# Patient Record
Sex: Female | Born: 1955 | Race: White | Hispanic: No | Marital: Married | Smoking: Never smoker
Health system: Southern US, Community
[De-identification: ages and names within clinical notes are randomized; demographics above are authoritative.]

## PROBLEM LIST (undated history)

## (undated) DIAGNOSIS — T7840XA Allergy, unspecified, initial encounter: Secondary | ICD-10-CM

## (undated) DIAGNOSIS — I10 Essential (primary) hypertension: Secondary | ICD-10-CM

## (undated) DIAGNOSIS — E119 Type 2 diabetes mellitus without complications: Secondary | ICD-10-CM

## (undated) DIAGNOSIS — R7301 Impaired fasting glucose: Secondary | ICD-10-CM

## (undated) DIAGNOSIS — E785 Hyperlipidemia, unspecified: Secondary | ICD-10-CM

## (undated) DIAGNOSIS — M8580 Other specified disorders of bone density and structure, unspecified site: Secondary | ICD-10-CM

## (undated) HISTORY — DX: Essential (primary) hypertension: I10

## (undated) HISTORY — DX: Impaired fasting glucose: R73.01

## (undated) HISTORY — PX: DILATION AND CURETTAGE OF UTERUS: SHX78

## (undated) HISTORY — DX: Type 2 diabetes mellitus without complications: E11.9

## (undated) HISTORY — DX: Other specified disorders of bone density and structure, unspecified site: M85.80

## (undated) HISTORY — DX: Allergy, unspecified, initial encounter: T78.40XA

## (undated) HISTORY — DX: Hyperlipidemia, unspecified: E78.5

## (undated) HISTORY — PX: COLONOSCOPY: SHX174

---

## 2007-02-01 ENCOUNTER — Emergency Department (HOSPITAL_COMMUNITY): Admission: EM | Admit: 2007-02-01 | Discharge: 2007-02-02 | Payer: Self-pay | Admitting: Emergency Medicine

## 2007-09-01 IMAGING — CT CT HEAD W/O CM
1 of 2 series · 13 of 30 positions shown, 17 images · IV contrast (agent unspecified)
Comparison: none

CLINICAL DATA: High blood pressure. Dizziness.
 HEAD CT WITHOUT CONTRAST:
TECHNIQUE: Contiguous axial images were obtained from the base of the skull through the vertex according to standard protocol without contrast.

[Series 2: brain · axial · 0.47mm/px · z∈[+126,+258]mm · 13 of 32 slices shown, 17 images]
[im 3/32  brain]
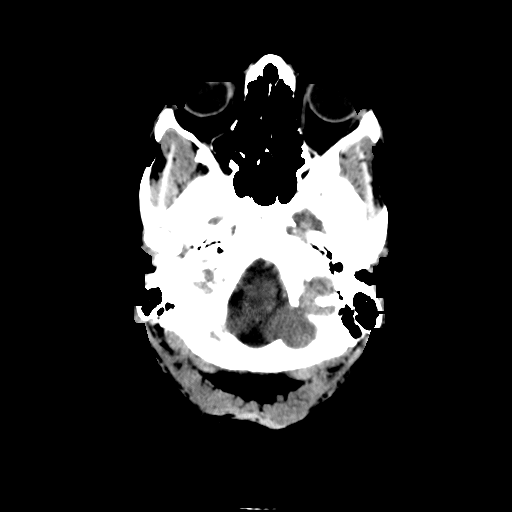
[im 3/32  bone]
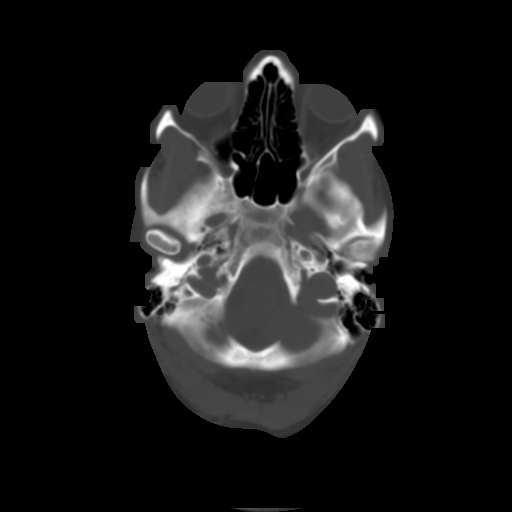
[im 5/32  brain]
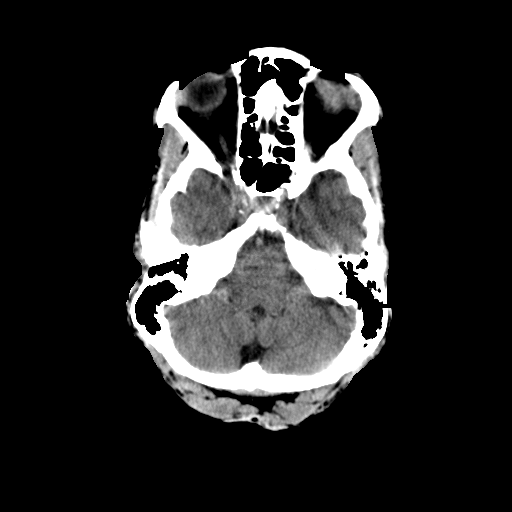
[im 7/32  brain]
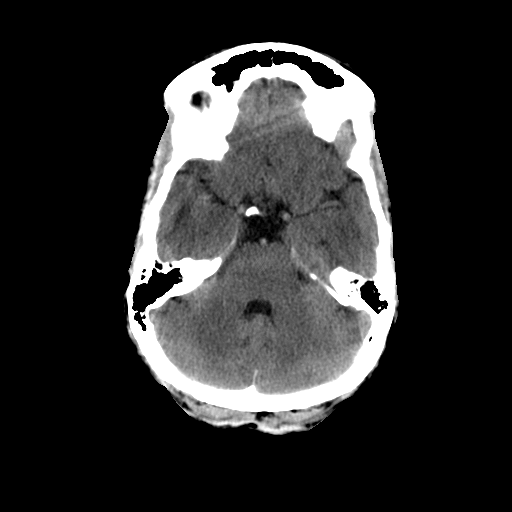
[im 9/32  brain]
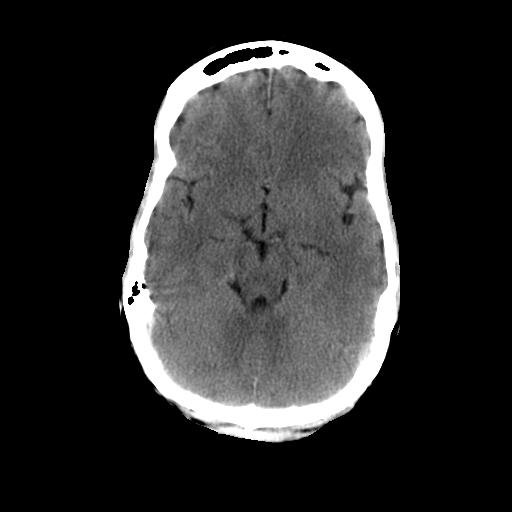
[im 12/32  brain]
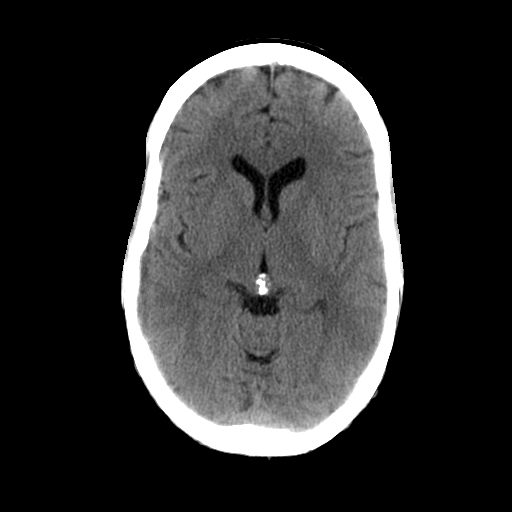
[im 12/32  bone]
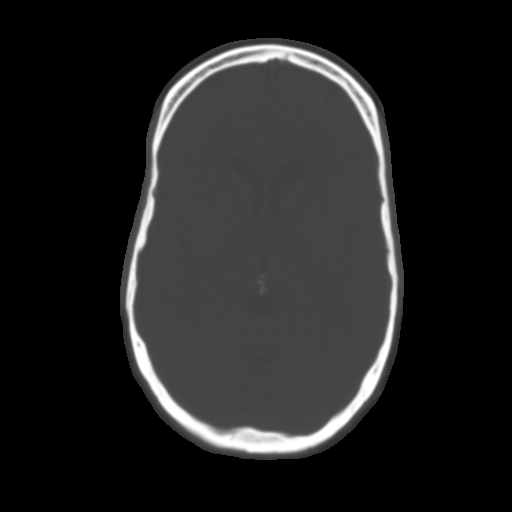
[im 14/32  brain]
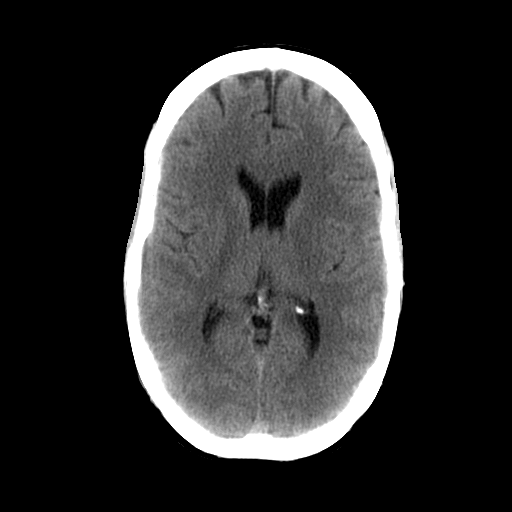
[im 16/32  brain]
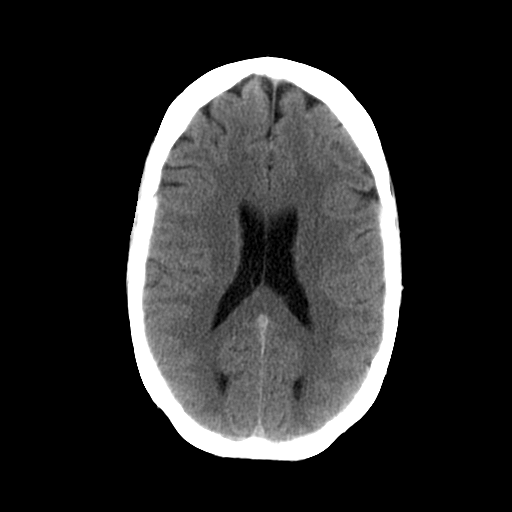
[im 18/32  brain]
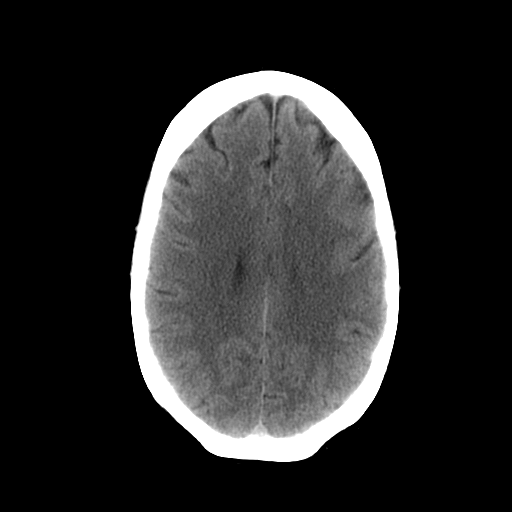
[im 20/32  brain]
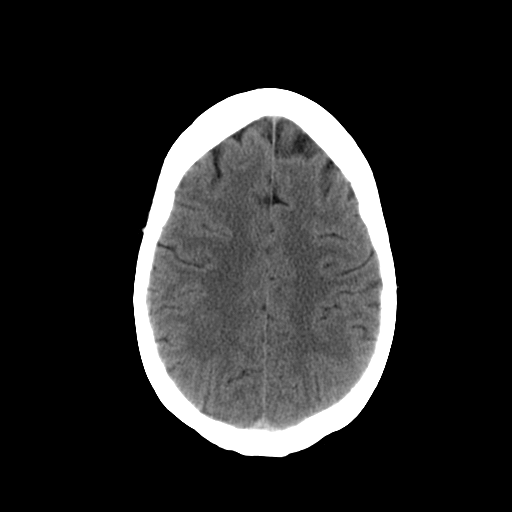
[im 20/32  bone]
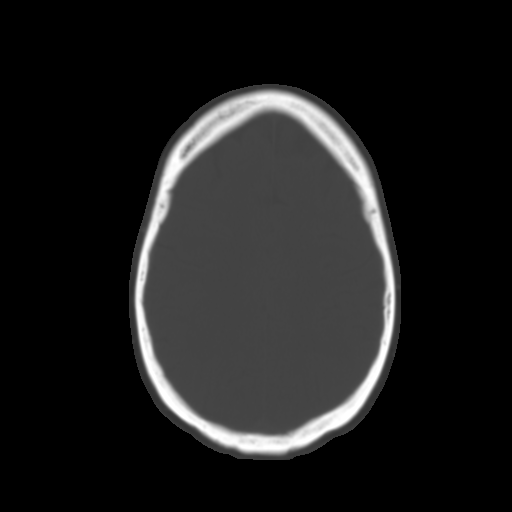
[im 23/32  brain]
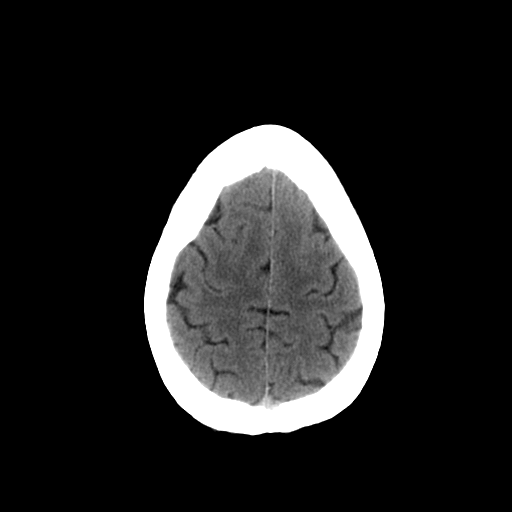
[im 25/32  brain]
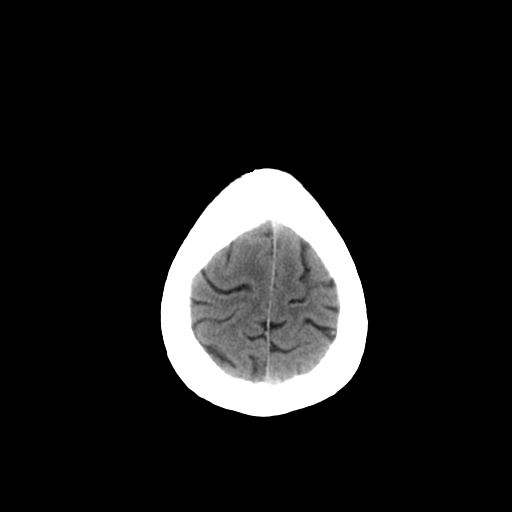
[im 27/32  brain]
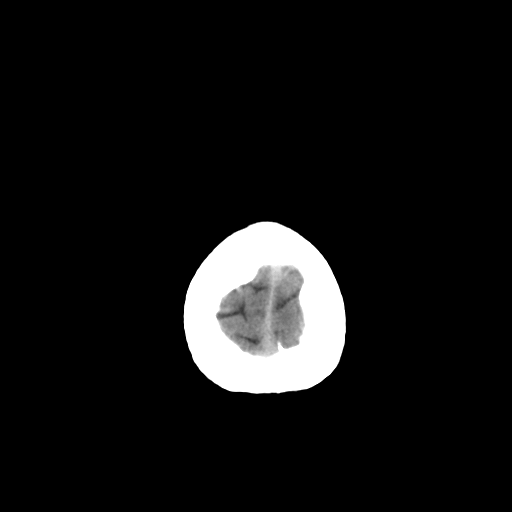
[im 29/32  brain]
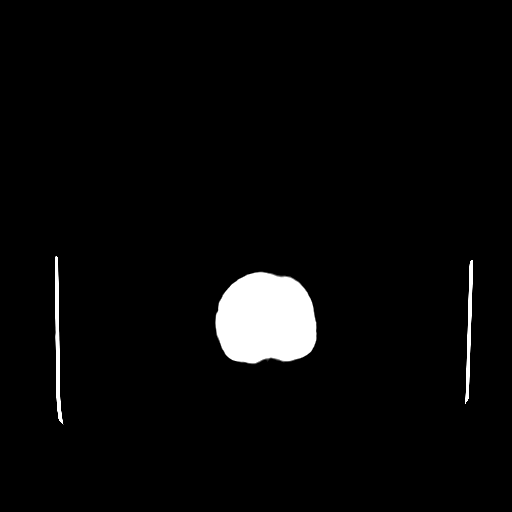
[im 29/32  bone]
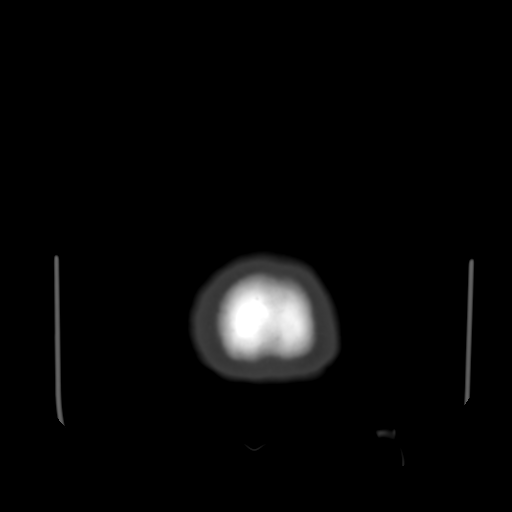

[13 of 30 positions shown; findings below may reference images not displayed]

FINDINGS: The brain appears normal without evidence of hemorrhage, infarct, mass, mass effect, midline shift or abnormal extraaxial fluid collection. No hydrocephalus.  Imaged paranasal sinuses and mastoid air cells are clear.
IMPRESSION: No acute intracranial abnormality.

## 2007-12-20 LAB — HM COLONOSCOPY

## 2010-12-28 LAB — HM DEXA SCAN: HM Dexa Scan: NORMAL

## 2012-01-18 LAB — HM PAP SMEAR

## 2013-01-19 LAB — HM MAMMOGRAPHY

## 2014-02-07 ENCOUNTER — Other Ambulatory Visit: Payer: Self-pay | Admitting: *Deleted

## 2014-02-07 DIAGNOSIS — R5381 Other malaise: Secondary | ICD-10-CM

## 2014-02-07 DIAGNOSIS — IMO0001 Reserved for inherently not codable concepts without codable children: Secondary | ICD-10-CM

## 2014-02-07 DIAGNOSIS — Z0000 Encounter for general adult medical examination without abnormal findings: Secondary | ICD-10-CM

## 2014-02-07 DIAGNOSIS — R5383 Other fatigue: Secondary | ICD-10-CM

## 2014-02-07 DIAGNOSIS — E1165 Type 2 diabetes mellitus with hyperglycemia: Principal | ICD-10-CM

## 2014-02-07 DIAGNOSIS — E785 Hyperlipidemia, unspecified: Secondary | ICD-10-CM

## 2014-02-10 ENCOUNTER — Other Ambulatory Visit: Payer: Self-pay

## 2014-02-11 ENCOUNTER — Encounter: Payer: Self-pay | Admitting: *Deleted

## 2014-02-11 DIAGNOSIS — N951 Menopausal and female climacteric states: Secondary | ICD-10-CM | POA: Insufficient documentation

## 2014-02-11 DIAGNOSIS — R000 Tachycardia, unspecified: Secondary | ICD-10-CM | POA: Insufficient documentation

## 2014-02-11 DIAGNOSIS — R030 Elevated blood-pressure reading, without diagnosis of hypertension: Secondary | ICD-10-CM | POA: Insufficient documentation

## 2014-02-11 DIAGNOSIS — M159 Polyosteoarthritis, unspecified: Secondary | ICD-10-CM

## 2014-02-11 LAB — COMPLETE METABOLIC PANEL WITH GFR
ALT: 13 U/L (ref 0–35)
AST: 14 U/L (ref 0–37)
Albumin: 4.4 g/dL (ref 3.5–5.2)
Alkaline Phosphatase: 49 U/L (ref 39–117)
BUN: 18 mg/dL (ref 6–23)
CO2: 27 mEq/L (ref 19–32)
Calcium: 9.1 mg/dL (ref 8.4–10.5)
Chloride: 102 mEq/L (ref 96–112)
Creat: 0.68 mg/dL (ref 0.50–1.10)
GFR, Est African American: >89 mL/min
GFR, Est Non African American: >89 mL/min
Glucose, Bld: 101 mg/dL — ABNORMAL HIGH (ref 70–99)
Potassium: 4.2 mEq/L (ref 3.5–5.3)
Sodium: 140 mEq/L (ref 135–145)
Total Bilirubin: 0.5 mg/dL (ref 0.2–1.2)
Total Protein: 6.6 g/dL (ref 6.0–8.3)

## 2014-02-11 LAB — CBC WITH DIFFERENTIAL/PLATELET
Basophils Absolute: 0 10*3/uL (ref 0.0–0.1)
Basophils Relative: 0 % (ref 0–1)
Eosinophils Absolute: 0.1 10*3/uL (ref 0.0–0.7)
Eosinophils Relative: 2 % (ref 0–5)
HCT: 40.7 % (ref 36.0–46.0)
Hemoglobin: 13.9 g/dL (ref 12.0–15.0)
Lymphocytes Relative: 30 % (ref 12–46)
Lymphs Abs: 1.9 10*3/uL (ref 0.7–4.0)
MCH: 29.6 pg (ref 26.0–34.0)
MCHC: 34.2 g/dL (ref 30.0–36.0)
MCV: 86.6 fL (ref 78.0–100.0)
Monocytes Absolute: 0.4 10*3/uL (ref 0.1–1.0)
Monocytes Relative: 6 % (ref 3–12)
Neutro Abs: 3.8 10*3/uL (ref 1.7–7.7)
Neutrophils Relative %: 62 % (ref 43–77)
Platelets: 309 10*3/uL (ref 150–400)
RBC: 4.7 MIL/uL (ref 3.87–5.11)
RDW: 13.5 % (ref 11.5–15.5)
WBC: 6.2 10*3/uL (ref 4.0–10.5)

## 2014-02-11 LAB — TSH: TSH: 1.97 u[IU]/mL (ref 0.350–4.500)

## 2014-02-11 LAB — LIPID PANEL
Cholesterol: 181 mg/dL (ref 0–200)
HDL: 56 mg/dL (ref >39)
LDL Cholesterol: 101 mg/dL — ABNORMAL HIGH (ref 0–99)
Total CHOL/HDL Ratio: 3.2 Ratio
Triglycerides: 118 mg/dL (ref <150)
VLDL: 24 mg/dL (ref 0–40)

## 2014-02-11 LAB — HEMOGLOBIN A1C
Hgb A1c MFr Bld: 5.7 % — ABNORMAL HIGH (ref <5.7)
Mean Plasma Glucose: 117 mg/dL — ABNORMAL HIGH (ref <117)

## 2014-02-18 ENCOUNTER — Ambulatory Visit (INDEPENDENT_AMBULATORY_CARE_PROVIDER_SITE_OTHER): Payer: BC Managed Care – PPO | Admitting: Family Medicine

## 2014-02-18 ENCOUNTER — Encounter: Payer: Self-pay | Admitting: Family Medicine

## 2014-02-18 ENCOUNTER — Encounter (INDEPENDENT_AMBULATORY_CARE_PROVIDER_SITE_OTHER): Payer: Self-pay

## 2014-02-18 VITALS — BP 151/90 | HR 114 | Resp 16 | Ht 63.5 in | Wt 159.0 lb

## 2014-02-18 DIAGNOSIS — R7301 Impaired fasting glucose: Secondary | ICD-10-CM

## 2014-02-18 DIAGNOSIS — E785 Hyperlipidemia, unspecified: Secondary | ICD-10-CM

## 2014-02-18 MED ORDER — METFORMIN HCL ER (MOD) 500 MG PO TB24: 1000.0000 mg | ORAL_TABLET | Freq: Two times a day (BID) | ORAL | Status: DC

## 2014-02-18 MED ORDER — METFORMIN HCL ER 500 MG PO TB24: 1000.0000 mg | ORAL_TABLET | Freq: Every day | ORAL | Status: DC

## 2014-02-18 MED ORDER — METFORMIN HCL ER (OSM) 1000 MG PO TB24: 1000.0000 mg | ORAL_TABLET | Freq: Two times a day (BID) | ORAL | Status: DC

## 2014-02-18 MED ORDER — PRAVASTATIN SODIUM 40 MG PO TABS: 40.0000 mg | ORAL_TABLET | Freq: Every day | ORAL | Status: DC

## 2014-02-18 NOTE — Progress Notes (Signed)
Subjective:    Patient ID: Doris Robinson, female    DOB: 29-Jan-1956, 58 y.o.   MRN: [id number]  HPI  [name] is here today to go over her most recent lab results.  She has done well since her last office visit.  She needs to discuss the conditions listed below:  1)  Hyperlipidemia - She is taking pravastatin (40 mg daily). Her only complaint is that she feels that it gives her some dry mouth.       2)  IFG - She has been taking Glumetza (1000 mg daily).  She was unable to tolerate 2000 mg.  She needs a refill on it.     Review of Systems  Constitutional: Negative for activity change, fatigue and unexpected weight change.  HENT: Negative.   Eyes: Negative.   Respiratory: Negative for shortness of breath.   Cardiovascular: Negative for chest pain, palpitations and leg swelling.  Gastrointestinal: Negative for diarrhea and constipation.  Endocrine: Negative.        Dry mouth after taking pravastatin  Genitourinary: Negative for difficulty urinating.  Musculoskeletal: Negative.   Skin: Negative.   Neurological: Negative.   Hematological: Negative for adenopathy. Does not bruise/bleed easily.  Psychiatric/Behavioral: Negative for sleep disturbance and dysphoric mood. The patient is not nervous/anxious.      Past Medical History  Diagnosis Date  . Hyperlipidemia   . IFG (impaired fasting glucose)      Past Surgical History  Procedure Laterality Date  . Dilation and curettage of uterus       History   Social History Narrative   Marital Status:  Married Administrator, sports)   Children:  None    Pets:  None    Living Situation: Lives with spouse   Occupation: Retired    Education: Forensic psychologist   Tobacco Use/Exposure:  None    Alcohol Use:  Occasional   Drug Use:  None   Diet:  Regular   Exercise:  Walking 60 minutes per day   Hobbies: Reading, Ecologist     Family History  Problem Relation Age of Onset  . Heart disease Father   . Diabetes Father   .  Hyperlipidemia Father   . Hypertension Father   . Asthma Father   . Thyroid disease Father   . Stroke Maternal Grandmother   . Cancer Mother     Troat Cancer     Current Outpatient Prescriptions on File Prior to Visit  Medication Sig Dispense Refill  . progesterone (PROMETRIUM) 100 MG capsule Take 100 mg by mouth daily.       No current facility-administered medications on file prior to visit.     Allergies  Allergen Reactions  . Sulfa Antibiotics Hives     Immunization History  Administered Date(s) Administered  . Tdap 11/07/2007       Objective:   Physical Exam  Vitals reviewed. Constitutional: She is oriented to person, place, and time.  Eyes: Conjunctivae are normal. No scleral icterus.  Neck: Neck supple. No thyromegaly present.  Cardiovascular: Normal rate, regular rhythm and normal heart sounds.   Pulmonary/Chest: Effort normal and breath sounds normal.  Musculoskeletal: She exhibits no edema and no tenderness.  Lymphadenopathy:    She has no cervical adenopathy.  Neurological: She is alert and oriented to person, place, and time.  Skin: Skin is warm and dry.  Psychiatric: She has a normal mood and affect. Her behavior is normal. Judgment and thought content normal.      Assessment &  Plan:    Adlynn was seen today for medication management.  Diagnoses and associated orders for this visit:  Other and unspecified hyperlipidemia - pravastatin (PRAVACHOL) 40 MG tablet; Take 1 tablet (40 mg total) by mouth daily.  IFG (impaired fasting glucose) - metFORMIN (GLUMETZA) 500 MG (MOD) 24 hr tablet; Take 2 tablets (1,000 mg total) by mouth 2 (two) times daily with a meal.

## 2014-02-20 MED ORDER — METFORMIN HCL ER (MOD) 500 MG PO TB24: 1000.0000 mg | ORAL_TABLET | Freq: Two times a day (BID) | ORAL | Status: DC

## 2018-02-13 ENCOUNTER — Other Ambulatory Visit: Payer: Self-pay | Admitting: Obstetrics and Gynecology

## 2018-02-13 DIAGNOSIS — R928 Other abnormal and inconclusive findings on diagnostic imaging of breast: Secondary | ICD-10-CM

## 2018-02-16 ENCOUNTER — Ambulatory Visit
Admission: RE | Admit: 2018-02-16 | Discharge: 2018-02-16 | Disposition: A | Discharge status: Home / Self Care | Payer: BLUE CROSS/BLUE SHIELD | Source: Ambulatory Visit | Attending: Obstetrics and Gynecology | Admitting: Obstetrics and Gynecology

## 2018-02-16 ENCOUNTER — Ambulatory Visit: Payer: Self-pay

## 2018-02-16 DIAGNOSIS — R928 Other abnormal and inconclusive findings on diagnostic imaging of breast: Secondary | ICD-10-CM

## 2018-03-09 ENCOUNTER — Encounter: Payer: Self-pay | Admitting: Internal Medicine

## 2018-05-07 ENCOUNTER — Ambulatory Visit (AMBULATORY_SURGERY_CENTER): Payer: Self-pay | Admitting: *Deleted

## 2018-05-07 ENCOUNTER — Other Ambulatory Visit: Payer: Self-pay

## 2018-05-07 VITALS — Ht 63.0 in | Wt 167.0 lb

## 2018-05-07 DIAGNOSIS — Z1211 Encounter for screening for malignant neoplasm of colon: Secondary | ICD-10-CM

## 2018-05-07 MED ORDER — NA SULFATE-K SULFATE-MG SULF 17.5-3.13-1.6 GM/177ML PO SOLN: 1.0000 | Freq: Once | ORAL | 0 refills | Status: AC

## 2018-05-07 NOTE — Progress Notes (Signed)
No egg or soy allergy known to patient  No issues with past sedation with any surgeries  or procedures, no intubation problems  No diet pills per patient No home 02 use per patient  No blood thinners per patient  Pt denies issues with constipation  No A fib or A flutter  EMMI video sent to pt's e mail  Suprep pay no more than $50 coupon to pt today in [place] Husband in [place] with pt today

## 2018-05-21 ENCOUNTER — Ambulatory Visit (AMBULATORY_SURGERY_CENTER): Payer: BLUE CROSS/BLUE SHIELD | Admitting: Internal Medicine

## 2018-05-21 ENCOUNTER — Encounter: Payer: Self-pay | Admitting: Internal Medicine

## 2018-05-21 VITALS — BP 128/77 | HR 64 | Temp 97.8°F | Resp 18 | Ht 63.0 in | Wt 167.0 lb

## 2018-05-21 DIAGNOSIS — D125 Benign neoplasm of sigmoid colon: Secondary | ICD-10-CM | POA: Diagnosis not present

## 2018-05-21 DIAGNOSIS — Z1211 Encounter for screening for malignant neoplasm of colon: Secondary | ICD-10-CM | POA: Diagnosis not present

## 2018-05-21 DIAGNOSIS — K635 Polyp of colon: Secondary | ICD-10-CM

## 2018-05-21 MED ORDER — SODIUM CHLORIDE 0.9 % IV SOLN: 500.0000 mL | Freq: Once | INTRAVENOUS | Status: AC

## 2018-05-21 NOTE — Op Note (Signed)
Doris Robinson Procedure Date: 05/21/2018 8:41 AM MRN: [id number] Endoscopist: Jerene Bears , MD Age: 62 Referring MD:  Date of Birth: 06/09/1956 Gender: Female Account #: [id number] Procedure:                Colonoscopy Indications:              Screening for colorectal malignant neoplasm, Last                            colonoscopy: 2009 Medicines:                Monitored Anesthesia Care Procedure:                Pre-Anesthesia Assessment:                           - Prior to the procedure, a History and Physical                            was performed, and patient medications and                            allergies were reviewed. The patient's tolerance of                            previous anesthesia was also reviewed. The risks                            and benefits of the procedure and the sedation                            options and risks were discussed with the patient.                            All questions were answered, and informed consent                            was obtained. Prior Anticoagulants: The patient has                            taken no previous anticoagulant or antiplatelet                            agents. ASA Grade Assessment: II - A patient with                            mild systemic disease. After reviewing the risks                            and benefits, the patient was deemed in                            satisfactory condition to undergo the procedure.  After obtaining informed consent, the colonoscope                            was passed under direct vision. Throughout the                            procedure, the patient's blood pressure, pulse, and                            oxygen saturations were monitored continuously. The                            Model PCF-H190DL [id number]) scope was introduced                            through the anus and advanced to the cecum,                             identified by appendiceal orifice and ileocecal                            valve. The colonoscopy was performed without                            difficulty. The patient tolerated the procedure                            well. The quality of the bowel preparation was                            good. The ileocecal valve, appendiceal orifice, and                            rectum were photographed. Scope In: 8:52:22 AM Scope Out: 9:11:18 AM Scope Withdrawal Time: 0 hours 12 minutes 18 seconds  Total Procedure Duration: 0 hours 18 minutes 56 seconds  Findings:                 The digital rectal exam was normal.                           Two sessile polyps were found in the sigmoid colon.                            The polyps were 3 to 4 mm in size. These polyps                            were removed with a cold snare. Resection and                            retrieval were complete.                           Multiple small-mouthed diverticula were found in  the sigmoid colon.                           Internal hemorrhoids were found during                            retroflexion. The hemorrhoids were small. Complications:            No immediate complications. Estimated Blood Loss:     Estimated blood loss was minimal. Impression:               - Two 3 to 4 mm polyps in the sigmoid colon,                            removed with a cold snare. Resected and retrieved.                           - Diverticulosis in the sigmoid colon.                           - Small internal hemorrhoids. Recommendation:           - Patient has a contact number available for                            emergencies. The signs and symptoms of potential                            delayed complications were discussed with the                            patient. Return to normal activities tomorrow.                            Written discharge instructions were  provided to the                            patient.                           - Resume previous diet.                           - Continue present medications.                           - Await pathology results.                           - Repeat colonoscopy is recommended. The                            colonoscopy date will be determined after pathology                            results from today's exam become available for  review. Jerene Bears, MD 05/21/2018 9:18:35 AM This report has been signed electronically.

## 2018-05-21 NOTE — Patient Instructions (Signed)
Impression/Recommendations:  Polyp handout given to patient. Diverticulosis handout given to patient. Hemorrhoid handout given to patient.  Resume previous diet. Continue present medications.  Repeat colonoscopy recommended.  Date to be determined after pathology results reviewed.  YOU HAD AN ENDOSCOPIC PROCEDURE TODAY AT [facility]:   Refer to the procedure report that was given to you for any specific questions about what was found during the examination.  If the procedure report does not answer your questions, please call your gastroenterologist to clarify.  If you requested that your care partner not be given the details of your procedure findings, then the procedure report has been included in a sealed envelope for you to review at your convenience later.  YOU SHOULD EXPECT: Some feelings of bloating in the abdomen. Passage of more gas than usual.  Walking can help get rid of the air that was put into your GI tract during the procedure and reduce the bloating. If you had a lower endoscopy (such as a colonoscopy or flexible sigmoidoscopy) you may notice spotting of blood in your stool or on the toilet paper. If you underwent a bowel prep for your procedure, you may not have a normal bowel movement for a few days.  Please Note:  You might notice some irritation and congestion in your nose or some drainage.  This is from the oxygen used during your procedure.  There is no need for concern and it should clear up in a day or so.  SYMPTOMS TO REPORT IMMEDIATELY:   Following lower endoscopy (colonoscopy or flexible sigmoidoscopy):  Excessive amounts of blood in the stool  Significant tenderness or worsening of abdominal pains  Swelling of the abdomen that is new, acute  Fever of 100F or higher For urgent or emergent issues, a gastroenterologist can be reached at any hour by calling [phone].   DIET:  We do recommend a small meal at first, but then you may proceed to  your regular diet.  Drink plenty of fluids but you should avoid alcoholic beverages for 24 hours.  ACTIVITY:  You should plan to take it easy for the rest of today and you should NOT DRIVE or use heavy machinery until tomorrow (because of the sedation medicines used during the test).    FOLLOW UP: Our staff will call the number listed on your records the next business day following your procedure to check on you and address any questions or concerns that you may have regarding the information given to you following your procedure. If we do not reach you, we will leave a message.  However, if you are feeling well and you are not experiencing any problems, there is no need to return our call.  We will assume that you have returned to your regular daily activities without incident.  If any biopsies were taken you will be contacted by phone or by letter within the next 1-3 weeks.  Please call us at [phone] if you have not heard about the biopsies in 3 weeks.    SIGNATURES/CONFIDENTIALITY: You and/or your care partner have signed paperwork which will be entered into your electronic medical record.  These signatures attest to the fact that that the information above on your After Visit Summary has been reviewed and is understood.  Full responsibility of the confidentiality of this discharge information lies with you and/or your care-partner.

## 2018-05-21 NOTE — Progress Notes (Signed)
To PACU, VSS. Report to [email or web address] 

## 2018-05-21 NOTE — Progress Notes (Signed)
Pt's states no medical or surgical changes since previsit or office visit. 

## 2018-05-21 NOTE — Progress Notes (Signed)
Called to room to assist during endoscopic procedure.  Patient ID and intended procedure confirmed with present staff. Received instructions for my participation in the procedure from the performing physician.  

## 2018-05-22 ENCOUNTER — Telehealth: Payer: Self-pay | Admitting: *Deleted

## 2018-05-22 NOTE — Telephone Encounter (Signed)
  Follow up Call-  Call back number 05/21/2018  Post procedure Call Back phone  # [phone]  Permission to leave phone message Yes  Some recent data might be hidden     Patient questions:  Do you have a fever, pain , or abdominal swelling? No. Pain Score  0 *  Have you tolerated food without any problems? Yes.    Have you been able to return to your normal activities? Yes.    Do you have any questions about your discharge instructions: Diet   No. Medications  No. Follow up visit  No.  Do you have questions or concerns about your Care? No.  Actions: * If pain score is 4 or above: No action needed, pain <4.

## 2018-05-28 ENCOUNTER — Encounter: Payer: Self-pay | Admitting: Internal Medicine

## 2018-08-09 ENCOUNTER — Encounter: Payer: Self-pay | Admitting: Nurse Practitioner

## 2018-08-31 ENCOUNTER — Ambulatory Visit: Payer: BLUE CROSS/BLUE SHIELD | Admitting: Physician Assistant

## 2018-08-31 ENCOUNTER — Encounter: Payer: Self-pay | Admitting: Physician Assistant

## 2018-08-31 VITALS — BP 156/86 | HR 80 | Ht 63.0 in | Wt 166.5 lb

## 2018-08-31 DIAGNOSIS — I10 Essential (primary) hypertension: Secondary | ICD-10-CM | POA: Diagnosis not present

## 2018-08-31 DIAGNOSIS — R0683 Snoring: Secondary | ICD-10-CM

## 2018-08-31 DIAGNOSIS — R000 Tachycardia, unspecified: Secondary | ICD-10-CM | POA: Diagnosis not present

## 2018-08-31 DIAGNOSIS — R030 Elevated blood-pressure reading, without diagnosis of hypertension: Secondary | ICD-10-CM | POA: Diagnosis not present

## 2018-08-31 NOTE — Progress Notes (Signed)
Cardiology Office Note    Date:  08/31/2018   ID:  Doris Robinson, DOB 1956-11-19, MRN [id number]  PCP:  Jonathon Resides, MD  Cardiologist:  New to Dr. Harrington Challenger  Chief Complaint: Blood pressure  History of Present Illness:   Doris Robinson is a 62 y.o. female with hx of DM, HTN and HLD referred by Dr. Dion Saucier for elevated blood pressure.   Patient always has normal blood pressure at home but high at PCP office thus send here for evaluation. Her BP runs in 100-130/60-70s at home. During office visit today manual blood pressure is 156/86 and BP on home machine is 169/104. She is anxious person at baseline. [place] does [place] 2 times/week and does push moving loan without any limitation. The patient denies nausea, vomiting, fever, chest pain, palpitations, shortness of breath, orthopnea, PND, dizziness, syncope, cough, congestion, abdominal pain, hematochezia, melena, lower extremity edema. Husband does complain of snoring. She have noted mild day time tiredness.   Remote exercise stress test was normal (about 10 years ago). Done for elevated blood pressure. Father had CABG at age 21.    Past Medical History:  Diagnosis Date  . Allergy    seasonal  . Diabetes mellitus without complication ([place])   . Hyperlipidemia   . Hypertension   . IFG (impaired fasting glucose)   . Osteopenia     Past Surgical History:  Procedure Laterality Date  . COLONOSCOPY    . DILATION AND CURETTAGE OF UTERUS      Current Medications: Prior to Admission medications   Medication Sig Start Date End Date Taking? Authorizing Provider  acetaminophen (TYLENOL) 500 MG tablet Take 500 mg by mouth every 6 (six) hours as needed.   Yes [provider]  cholecalciferol (VITAMIN D) 1000 units tablet Take 2,000 Units by mouth daily.   Yes [provider]  Coenzyme Q10 (CO Q 10) 100 MG CAPS Take by mouth daily.   Yes [provider]  estradiol (VIVELLE-DOT) 0.1 MG/24HR patch Place 1 patch onto the  skin 2 (two) times a week.   Yes [provider]  fluticasone (FLONASE) 50 MCG/ACT nasal spray 1 spray. 06/09/16  Yes [provider]  glucosamine-chondroitin 500-400 MG tablet Take 1 tablet by mouth 3 (three) times daily.   Yes [provider]  KOMBIGLYZE XR 5-500 MG TB24 TK 1 T PO Q NIGHT 04/17/18  Yes [provider]  losartan (COZAAR) 50 MG tablet Take 25 mg by mouth 2 (two) times daily after a meal.  03/18/18  Yes [provider]  Magnesium 250 MG TABS Take by mouth daily.   Yes [provider]  metFORMIN (GLUMETZA) 500 MG (MOD) 24 hr tablet Take 2 tablets (1,000 mg total) by mouth 2 (two) times daily with a meal. 02/20/14 08/31/18 Yes Zanard, Bernadene Bell, MD  Multiple Vitamins-Minerals (MULTIVITAMIN ADULT PO) Take by mouth daily. Centrum Silver   Yes [provider]  pravastatin (PRAVACHOL) 40 MG tablet Take 1 tablet (40 mg total) by mouth daily. 02/18/14 08/31/18 Yes Zanard, Bernadene Bell, MD  progesterone (PROMETRIUM) 100 MG capsule Take 100 mg by mouth daily.   Yes [provider]  rosuvastatin (CRESTOR) 20 MG tablet  04/16/18  Yes [provider]  vitamin C (ASCORBIC ACID) 500 MG tablet Take 500 mg by mouth daily.   Yes [provider]  Vitamins-Lipotropics (LIPO-FLAVONOID PLUS PO) Take by mouth daily.   Yes [provider]  zolpidem (AMBIEN) 5 MG tablet TK 1  T PO QD HS PRF SLEEP 03/07/18  Yes [provider]    Allergies:   Codeine and Sulfa antibiotics   Social History   Socioeconomic History  . Marital status: Married    Spouse name: Doren Custard  . Number of children: 0  . Years of education: 5  . Highest education level: Not on file  Occupational History  . Occupation: RETIRED  Social Needs  . Financial resource strain: Not on file  . Food insecurity:    Worry: Not on file    Inability: Not on file  . Transportation needs:    Medical: Not on file    Non-medical: Not on file  Tobacco  Use  . Smoking status: Never Smoker  . Smokeless tobacco: Never Used  Substance and Sexual Activity  . Alcohol use: Yes    Comment: 1 glass a week  . Drug use: No  . Sexual activity: Not Currently  Lifestyle  . Physical activity:    Days per week: Not on file    Minutes per session: Not on file  . Stress: Not on file  Relationships  . Social connections:    Talks on phone: Not on file    Gets together: Not on file    Attends religious service: Not on file    Active member of club or organization: Not on file    Attends meetings of clubs or organizations: Not on file    Relationship status: Not on file  Other Topics Concern  . Not on file  Social History Narrative   Marital Status:  Married Administrator, sports)   Children:  None    Pets:  None    Living Situation: Lives with spouse   Occupation: Retired    Education: Forensic psychologist   Tobacco Use/Exposure:  None    Alcohol Use:  Occasional   Drug Use:  None   Diet:  Regular   Exercise:  Walking 60 minutes per day   Hobbies: Reading, Ecologist     Family History:  The patient's family history includes Asthma in her father; Cancer in her mother; Coronary artery disease in her father; Diabetes in her father; Esophageal cancer in her mother; Heart disease in her father; Hyperlipidemia in her father; Hypertension in her father; Stroke in her maternal grandmother; Thyroid disease in her father.   ROS:   Please see the history of present illness.    ROS All other systems reviewed and are negative.   PHYSICAL EXAM:   VS:  BP (!) 156/86   Pulse 80   Ht 5\' 3"  (1.6 m)   Wt 166 lb 8 oz (75.5 kg)   SpO2 96%   BMI 29.49 kg/m    GEN: Well nourished, well developed, in no acute distress  HEENT: normal  Neck: no JVD, carotid bruits, or masses Cardiac: RRR; no murmurs, rubs, or gallops,no edema  Respiratory:  clear to auscultation bilaterally, normal work of breathing GI: soft, nontender, nondistended, + BS MS: no deformity or  atrophy  Skin: warm and dry, no rash Neuro:  Alert and Oriented x 3, Strength and sensation are intact Psych: euthymic mood, full affect  Wt Readings from Last 3 Encounters:  08/31/18 166 lb 8 oz (75.5 kg)  05/21/18 167 lb (75.8 kg)  05/07/18 167 lb (75.8 kg)      Studies/Labs Reviewed:   EKG:  EKG is ordered today.  The ekg ordered today demonstrates NSR with TWI in lead V1  Recent Labs: No results  found for requested labs within last 8760 hours.   Lipid Panel    Component Value Date/Time   CHOL 181 02/10/2014 0816   TRIG 118 02/10/2014 0816   HDL 56 02/10/2014 0816   CHOLHDL 3.2 02/10/2014 0816   VLDL 24 02/10/2014 0816   LDLCALC 101 (H) 02/10/2014 0816    Additional studies/ records that were reviewed today include:  As above    ASSESSMENT & PLAN:    1. Fluctuating blood pressure with hx of HTN -Her blood pressure runs normal at home. Manual blood pressure is 156/86 and BP on home machine is 169/104 in clinic today. She seems anxious today. Likely her symptoms due to [facility] Coat Syndrome. Husband noted snoring. She does have mild day time tiredness. She might have underlying sleep apnea. Declined sleep study. She will follow up with PCP for this.   2. Snoring As above  3. HLD Continue statin. Per PCP.   Plan reviewed with DOD Dr. Harrington Challenger.     Medication Adjustments/Labs and Tests Ordered: Current medicines are reviewed at length with the patient today.  Concerns regarding medicines are outlined above.  Medication changes, Labs and Tests ordered today are listed in the Patient Instructions below. Patient Instructions  Medication Instructions:  Your physician recommends that you continue on your current medications as directed. Please refer to the Current Medication list given to you today.   Labwork: None ordered  Testing/Procedures: YOUR PHYSICIAN WANTS YOU TO HAVE A 24-HOUR BLOOD PRESSURE MONITOR.  Follow-Up: Your physician wants you to follow-up BASED  UPON RESULTS  Any Other Special Instructions Will Be Listed Below (If Applicable).     If you need a refill on your cardiac medications before your next appointment, please call your pharmacy.          Jarrett Soho, [place]  08/31/2018 2:00 PM    [address], [place], [place]  [address] Phone: [phone]; Fax: [phone]

## 2018-08-31 NOTE — Patient Instructions (Addendum)
Medication Instructions:  Your physician recommends that you continue on your current medications as directed. Please refer to the Current Medication list given to you today.   Labwork: None ordered  Testing/Procedures: YOUR PHYSICIAN WANTS YOU TO HAVE A 24-HOUR BLOOD PRESSURE MONITOR.  Follow-Up: Your physician wants you to follow-up BASED UPON RESULTS  Any Other Special Instructions Will Be Listed Below (If Applicable).     If you need a refill on your cardiac medications before your next appointment, please call your pharmacy.

## 2018-09-03 ENCOUNTER — Ambulatory Visit: Payer: BLUE CROSS/BLUE SHIELD | Admitting: Nurse Practitioner

## 2018-09-11 ENCOUNTER — Encounter: Payer: Self-pay | Admitting: *Deleted

## 2018-09-11 ENCOUNTER — Ambulatory Visit (INDEPENDENT_AMBULATORY_CARE_PROVIDER_SITE_OTHER): Payer: BLUE CROSS/BLUE SHIELD

## 2018-09-11 DIAGNOSIS — R030 Elevated blood-pressure reading, without diagnosis of hypertension: Secondary | ICD-10-CM

## 2018-09-11 DIAGNOSIS — R000 Tachycardia, unspecified: Secondary | ICD-10-CM

## 2018-09-11 DIAGNOSIS — I10 Essential (primary) hypertension: Secondary | ICD-10-CM

## 2018-09-11 DIAGNOSIS — R0683 Snoring: Secondary | ICD-10-CM

## 2018-09-11 NOTE — Progress Notes (Signed)
Patient ID: Doris Robinson, female   DOB: 1956-04-07, 62 y.o.   MRN: [id number] 24 hour ambulatory blood pressure monitor applied using standard adult cuff.

## 2018-09-14 ENCOUNTER — Telehealth: Payer: Self-pay

## 2018-09-14 ENCOUNTER — Other Ambulatory Visit: Payer: Self-pay

## 2018-09-14 MED ORDER — LOSARTAN POTASSIUM 50 MG PO TABS: 50.0000 mg | ORAL_TABLET | Freq: Two times a day (BID) | ORAL | 0 refills | Status: DC

## 2018-09-14 NOTE — Telephone Encounter (Signed)
Per Dr. Harrington Challenger based on her Ambulatory Blood Pressure report.. Pt advised to increase her Cozaar ton 50mg  po bid. Pt verbalized understanding and will fax report to her PMD Dr. Larene Beach.

## 2018-09-16 IMAGING — MG DIGITAL DIAGNOSTIC UNILATERAL RIGHT MAMMOGRAM WITH TOMO AND CAD
4 series · 4 of 12 positions shown · non-contrast
Comparison: 02/08/2018, 02/07/2017 and earlier.

CLINICAL DATA: Recall from screening mammography with
tomosynthesis, possible asymmetry involving the central right breast
visible only on the MLO images.

EXAM:
DIGITAL DIAGNOSTIC UNILATERAL RIGHT MAMMOGRAM WITH CAD AND TOMO

[R ML synth-2D]
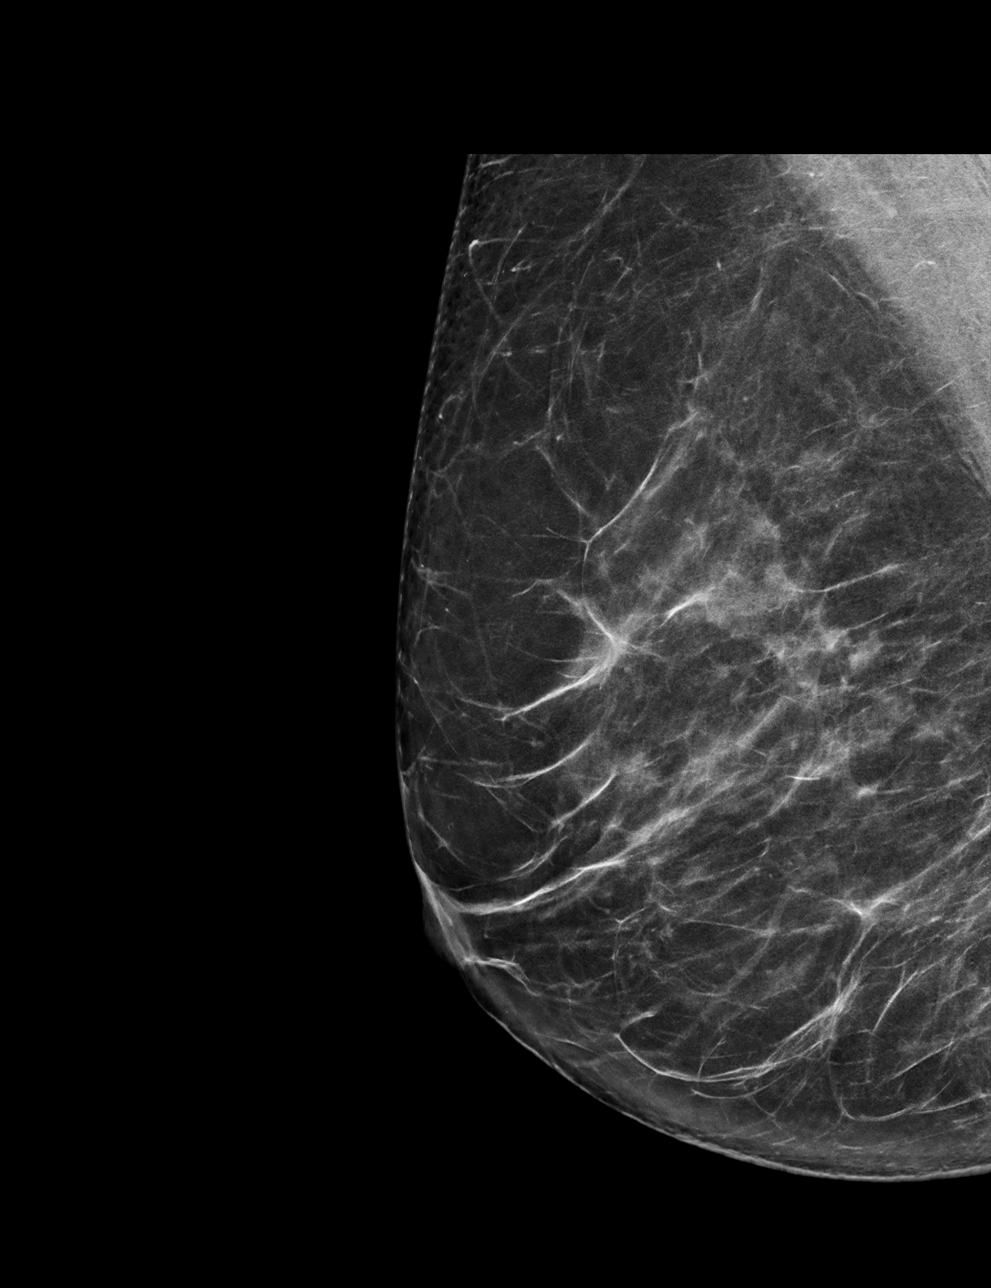

[R MLO synth-2D]
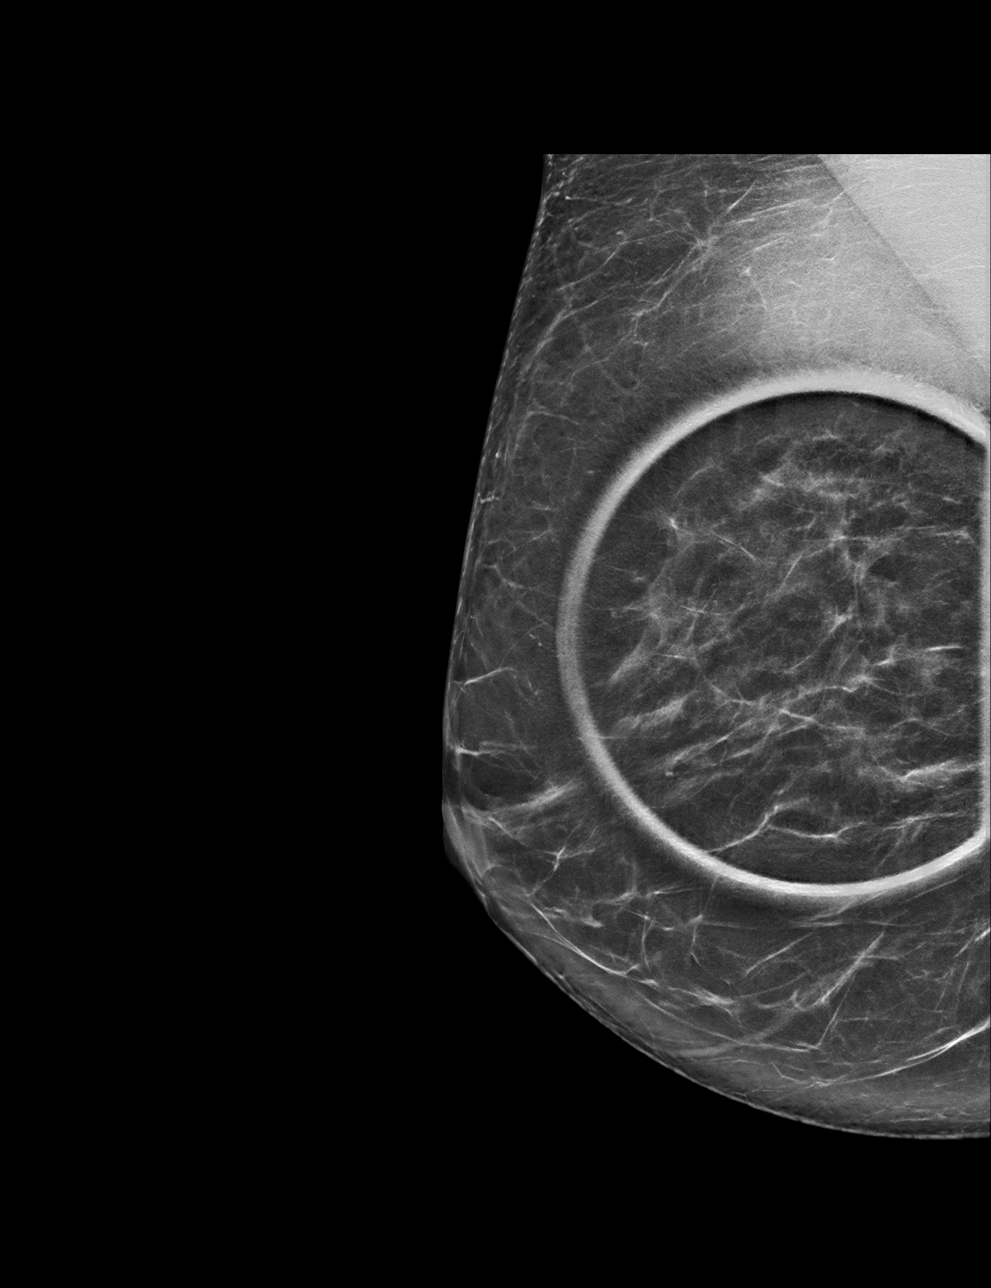

[R MLO tomo · tomo slice 41/80.0]
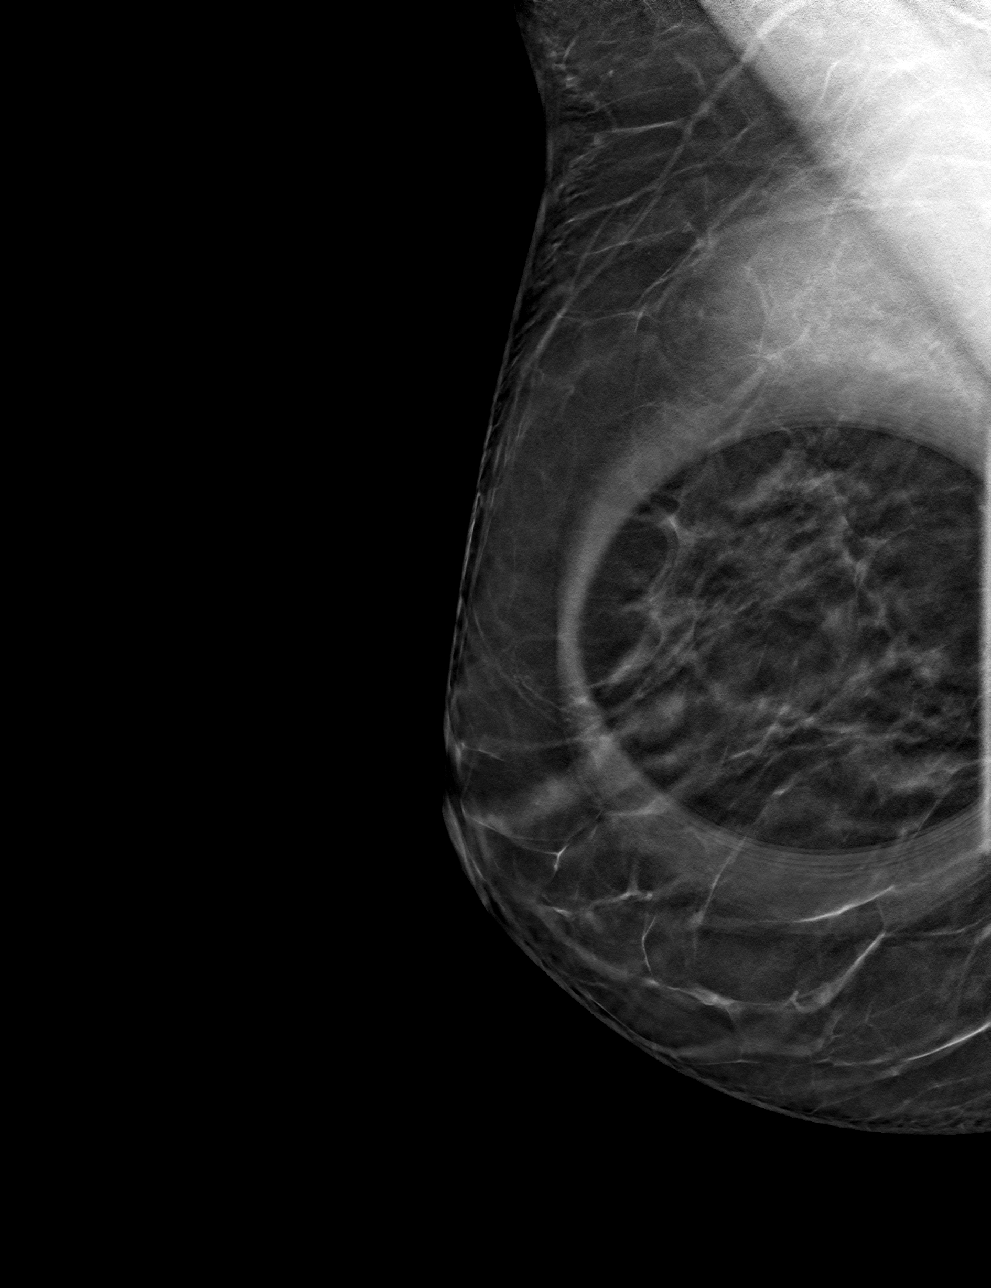

[R ML tomo · tomo slice 38/75.0]
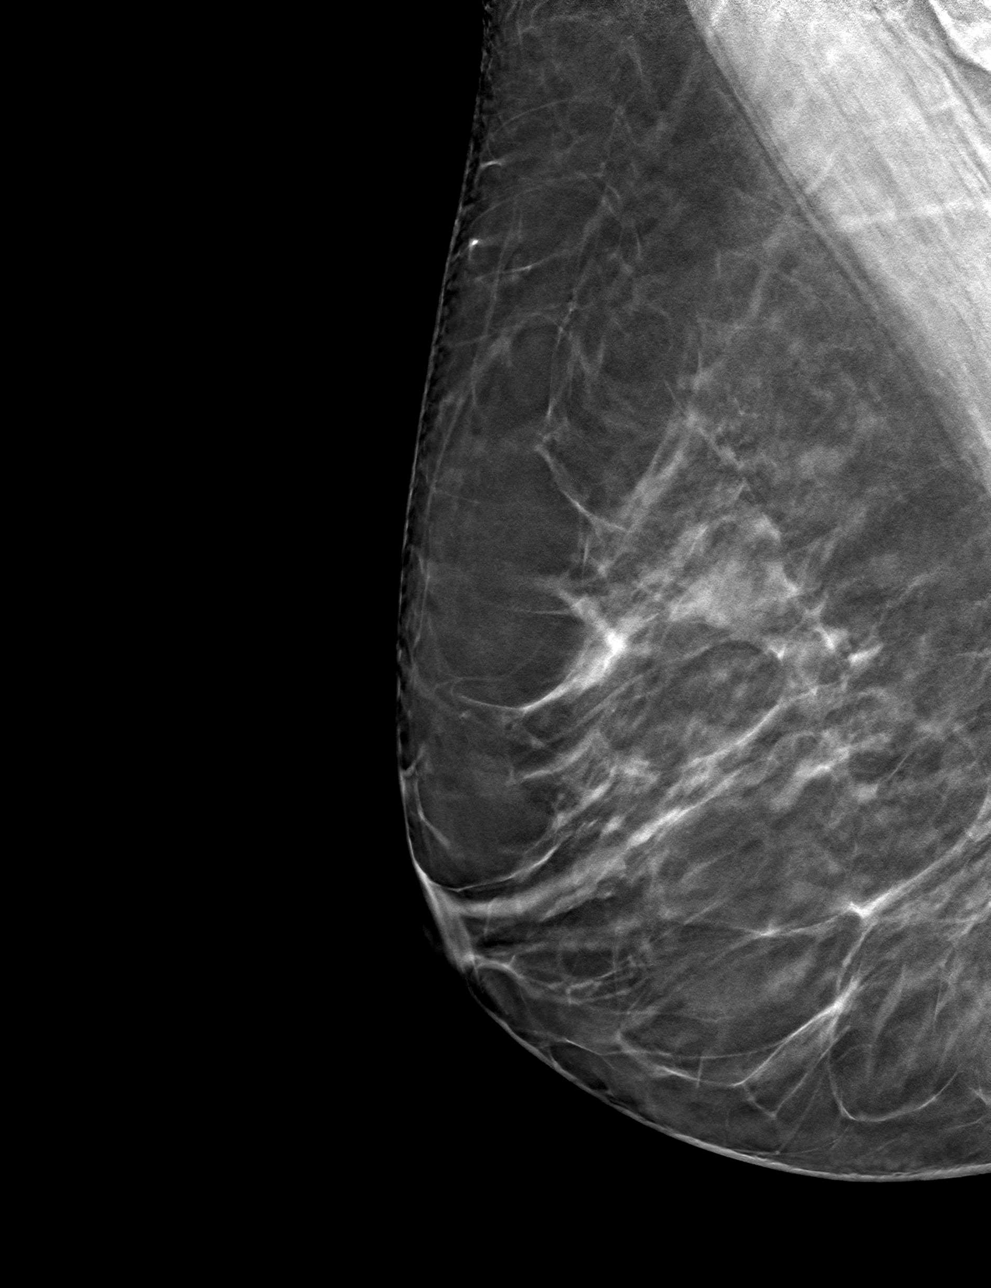

[4 of 12 positions shown; findings below may reference images not displayed]

ACR Breast Density Category b: There are scattered areas of
fibroglandular density.
FINDINGS: Tomosynthesis and synthesized spot compression MLO view of the area
of concern in the right breast and tomosynthesis and synthesized
full field mediolateral view of the right breast were obtained.

The possible asymmetry in the central right breast on screening
mammography disperses with compression and there is no underlying
mass or architectural distortion.

No findings suspicious for malignancy in the right breast.

The synthesized full field mediolateral image was processed with
CAD.
IMPRESSION: 1. No mammographic evidence of malignancy involving the right
breast.
2. The screening mammographic finding corresponds to normal focally
dense fibroglandular tissue.

RECOMMENDATION:
Screening mammogram in one year.(Code:YR-[id number])

I have discussed the findings and recommendations with the patient.
Results were also provided in writing at the conclusion of the
visit. If applicable, a reminder letter will be sent to the patient
regarding the next appointment.

BI-RADS CATEGORY  1: Negative.

## 2018-09-17 ENCOUNTER — Telehealth: Payer: Self-pay

## 2018-09-17 NOTE — Telephone Encounter (Signed)
**Note De-Identified Jayquan Bradsher Obfuscation** I have done a Losartan PA through covermymeds. Key: [id number]

## 2018-09-26 NOTE — Telephone Encounter (Signed)
**Note De-Identified [name] Obfuscation** Letter received Doris Robinson fax from [facility] stating that they have approved this Losartan PA. Approval good from 09/17/2018 until 09/15/2021  Reference# [id number]  I have notified the pts pharmacy.

## 2018-10-12 ENCOUNTER — Telehealth: Payer: Self-pay | Admitting: Physician Assistant

## 2018-10-12 NOTE — Telephone Encounter (Signed)
New Message          Dr Dion Saucier office is calling from [facility] to get the EKG results done on 08/31/2018 and the  24 hour Holter Monitor done on 09/11/2018 results faxed over. Their office # is [phone]  Their fax # is [phone]

## 2021-03-25 ENCOUNTER — Telehealth: Payer: Self-pay

## 2021-03-25 NOTE — Telephone Encounter (Signed)
Referral notes sent from [facility], Phone #: [phone], Fax #: [phone]   Notes sent to scheduling

## 2021-03-31 ENCOUNTER — Telehealth: Payer: Self-pay | Admitting: Internal Medicine

## 2021-03-31 NOTE — Telephone Encounter (Signed)
Patient requesting to switch from Dr. Harrington Challenger to Dr. Irish Lack

## 2021-04-01 NOTE — Telephone Encounter (Signed)
That is fine   It looks like I may have seen a few years ago with Vin

## 2021-05-10 ENCOUNTER — Ambulatory Visit: Payer: Self-pay | Admitting: Interventional Cardiology

## 2021-05-20 NOTE — Progress Notes (Signed)
Cardiology Office Note   Date:  05/21/2021   ID:  Doris Robinson, DOB [birth year], MRN [id number]  PCP:  Jonathon Resides, MD    No chief complaint on file.  HTN  Wt Readings from Last 3 Encounters:  05/21/21 178 lb 3.2 oz (80.8 kg)  08/31/18 166 lb 8 oz (75.5 kg)  05/21/18 167 lb (75.8 kg)       History of Present Illness: Doris Robinson is a 65 y.o. female   with hx of DM, HTN and HLD.  She was seen in 2019 by [facility].  24 hour ambulatory BP monitor  She has had issues with high BP readings in the office.  WHen she goes home, BP can be in the normal range. In Feb 2022, losartan was changed to irbesartan and Bystolic was added.   Three times in the last 4 months, She feels a headache, warm ears, "buzzing feeling."  BP would be higher at those times.  Husband notices that she snores and that she has some labored breathing during sleep.  Increased menopause sx recently.   Denies : Chest pain. Dizziness. Leg edema. Nitroglycerin use. Orthopnea. Palpitations. Paroxysmal nocturnal dyspnea. Shortness of breath. Syncope.       Past Medical History:  Diagnosis Date  . Allergy    seasonal  . Diabetes mellitus without complication ([place])   . Hyperlipidemia   . Hypertension   . IFG (impaired fasting glucose)   . Osteopenia     Past Surgical History:  Procedure Laterality Date  . COLONOSCOPY    . DILATION AND CURETTAGE OF UTERUS       Current Outpatient Medications  Medication Sig Dispense Refill  . acetaminophen (TYLENOL) 500 MG tablet Take 500 mg by mouth every 6 (six) hours as needed.    . cholecalciferol (VITAMIN D) 1000 units tablet Take 2,000 Units by mouth daily.    . clobetasol cream (TEMOVATE) 0.05 % Apply topically as needed.    . fluticasone (FLONASE) 50 MCG/ACT nasal spray 1 spray.    Marland Kitchen glucosamine-chondroitin 500-400 MG tablet Take 1 tablet by mouth 3 (three) times daily.    . irbesartan (AVAPRO) 300 MG tablet Take 300 mg by mouth every morning.    Marland Kitchen  JANUVIA 100 MG tablet Take 100 mg by mouth daily.    . metFORMIN (GLUCOPHAGE-XR) 500 MG 24 hr tablet Take 500 mg by mouth at bedtime.    Marland Kitchen MINOXIDIL, TOPICAL, 5 % SOLN at bedtime.    . Multiple Vitamins-Minerals (MULTIVITAMIN ADULT PO) Take by mouth daily. Centrum Silver    . nebivolol (BYSTOLIC) 5 MG tablet Take 5 mg by mouth daily.    . rosuvastatin (CRESTOR) 20 MG tablet   3  . vitamin C (ASCORBIC ACID) 500 MG tablet Take 500 mg by mouth daily.    . Vitamins-Lipotropics (LIPO-FLAVONOID PLUS PO) Take by mouth daily.     Current Facility-Administered Medications  Medication Dose Route Frequency Provider Last Rate Last Admin  . 0.9 %  sodium chloride infusion  500 mL Intravenous Once Pyrtle, Lajuan Lines, MD        Allergies:   Codeine and Sulfa antibiotics    Social History:  The patient  reports that she has never smoked. She has never used smokeless tobacco. She reports current alcohol use. She reports that she does not use drugs.   Family History:  The patient's family history includes Asthma in her father; Cancer in her mother; Coronary artery disease in her  father; Diabetes in her father; Esophageal cancer in her mother; Heart disease in her father; Hyperlipidemia in her father; Hypertension in her father; Stroke in her maternal grandmother; Thyroid disease in her father.    ROS:  Please see the history of present illness.   Otherwise, review of systems are positive for sleep disturbance.   All other systems are reviewed and negative.    PHYSICAL EXAM: VS:  BP (!) 174/82   Pulse 66   Ht 5\' 3"  (1.6 m)   Wt 178 lb 3.2 oz (80.8 kg)   SpO2 98%   BMI 31.57 kg/m  , BMI Body mass index is 31.57 kg/m. GEN: Well nourished, well developed, in no acute distress  HEENT: normal  Neck: no JVD, carotid bruits, or masses Cardiac: RRR; no murmurs, rubs, or gallops,no edema  Respiratory:  clear to auscultation bilaterally, normal work of breathing GI: soft, nontender, nondistended, + BS MS: no  deformity or atrophy  Skin: warm and dry, no rash Neuro:  Strength and sensation are intact Psych: euthymic mood, full affect   EKG:   The ekg ordered today demonstrates NSR, no ST changes; V1 T wave inversion unchanged   Recent Labs: No results found for requested labs within last 8760 hours.   Lipid Panel    Component Value Date/Time   CHOL 181 02/10/2014 0816   TRIG 118 02/10/2014 0816   HDL 56 02/10/2014 0816   CHOLHDL 3.2 02/10/2014 0816   VLDL 24 02/10/2014 0816   LDLCALC 101 (H) 02/10/2014 0816     Other studies Reviewed: Additional studies/ records that were reviewed today with results demonstrating: old ECG reviewed.   ASSESSMENT AND PLAN:  1. HTN: Controlled home readings.  Continue current meds. If systolic is > 242 at home and she has the symptomatic episode noted above, can take an extra Bystolic 5 mg x 1.  If this happens frequently , will have to adjust home meds. Home machine matched the manual blood pressure reading done by me here in the office, as noted above in the vitals section. 2. Hyperlipidemia: High-fiber, whole food, plant-based diet.  Avoid processed foods to decrease your salt intake. 3. Snoring: Declined sleep study in the past.  She does not feel that she is getting restful sleep. She wants to try to lose weight first before considering sleep study.    Current medicines are reviewed at length with the patient today.  The patient concerns regarding her medicines were addressed.  The following changes have been made:  No change  Labs/ tests ordered today include:   Orders Placed This Encounter  Procedures  . EKG 12-Lead    Recommend 150 minutes/week of aerobic exercise Low fat, low carb, high fiber diet recommended  Disposition:   FU in 1 year   Signed, Larae Grooms, MD  05/21/2021 3:59 PM    [facility] [address], [place], [place]  [address] Phone: [phone]; Fax: [phone]

## 2021-05-21 ENCOUNTER — Encounter: Payer: Self-pay | Admitting: Interventional Cardiology

## 2021-05-21 ENCOUNTER — Other Ambulatory Visit: Payer: Self-pay

## 2021-05-21 ENCOUNTER — Ambulatory Visit (INDEPENDENT_AMBULATORY_CARE_PROVIDER_SITE_OTHER): Payer: Medicare Other | Admitting: Interventional Cardiology

## 2021-05-21 VITALS — BP 174/82 | HR 66 | Ht 63.0 in | Wt 178.2 lb

## 2021-05-21 DIAGNOSIS — E782 Mixed hyperlipidemia: Secondary | ICD-10-CM | POA: Diagnosis not present

## 2021-05-21 DIAGNOSIS — R0683 Snoring: Secondary | ICD-10-CM

## 2021-05-21 DIAGNOSIS — I10 Essential (primary) hypertension: Secondary | ICD-10-CM | POA: Diagnosis not present

## 2021-05-21 NOTE — Patient Instructions (Addendum)
Medication Instructions:  Your provider recommends that you continue on your current medications as directed. Please refer to the Current Medication list given to you today.   *If you need a refill on your cardiac medications before your next appointment, please call your pharmacy*  Follow-Up: At [facility], you and your health needs are our priority.  As part of our continuing mission to provide you with exceptional heart care, we have created designated Provider Care Teams.  These Care Teams include your primary Cardiologist (physician) and Advanced Practice Providers (APPs -  Physician Assistants and Nurse Practitioners) who all work together to provide you with the care you need, when you need it. Your next appointment:   12 month(s) The format for your next appointment:   In Person Provider:   You may see Dr. Irish Lack or one of the following Advanced Practice Providers on your designated Care Team:    Richardson Dopp, PA-C  Vin Bhagat, PA-C    Low-Sodium Eating Plan Sodium, which is an element that makes up salt, helps you maintain a healthy balance of fluids in your body. Too much sodium can increase your blood pressure and cause fluid and waste to be held in your body. Your health care provider or dietitian may recommend following this plan if you have high blood pressure (hypertension), kidney disease, liver disease, or heart failure. Eating less sodium can help lower your blood pressure, reduce swelling, and protect your heart, liver, and kidneys. What are tips for following this plan? Reading food labels  The Nutrition Facts label lists the amount of sodium in one serving of the food. If you eat more than one serving, you must multiply the listed amount of sodium by the number of servings.  Choose foods with less than 140 mg of sodium per serving.  Avoid foods with 300 mg of sodium or more per serving. Shopping  Look for lower-sodium products, often labeled as "low-sodium"  or "no salt added."  Always check the sodium content, even if foods are labeled as "unsalted" or "no salt added."  Buy fresh foods. ? Avoid canned foods and pre-made or frozen meals. ? Avoid canned, cured, or processed meats.  Buy breads that have less than 80 mg of sodium per slice.   Cooking  Eat more home-cooked food and less restaurant, buffet, and fast food.  Avoid adding salt when cooking. Use salt-free seasonings or herbs instead of table salt or sea salt. Check with your health care provider or pharmacist before using salt substitutes.  Cook with plant-based oils, such as canola, sunflower, or olive oil.   Meal planning  When eating at a restaurant, ask that your food be prepared with less salt or no salt, if possible. Avoid dishes labeled as brined, pickled, cured, smoked, or made with soy sauce, miso, or teriyaki sauce.  Avoid foods that contain MSG (monosodium glutamate). MSG is sometimes added to [place] food, bouillon, and some canned foods.  Make meals that can be grilled, baked, poached, roasted, or steamed. These are generally made with less sodium. General information Most people on this plan should limit their sodium intake to 1,500-2,000 mg (milligrams) of sodium each day. What foods should I eat? Fruits Fresh, frozen, or canned fruit. Fruit juice. Vegetables Fresh or frozen vegetables. "No salt added" canned vegetables. "No salt added" tomato sauce and paste. Low-sodium or reduced-sodium tomato and vegetable juice. Grains Low-sodium cereals, including oats, puffed wheat and rice, and shredded wheat. Low-sodium crackers. Unsalted rice. Unsalted pasta. Low-sodium  bread. Whole-grain breads and whole-grain pasta. Meats and other proteins Fresh or frozen (no salt added) meat, poultry, seafood, and fish. Low-sodium canned tuna and salmon. Unsalted nuts. Dried peas, beans, and lentils without added salt. Unsalted canned beans. Eggs. Unsalted nut butters. Dairy Milk.  Soy milk. Cheese that is naturally low in sodium, such as ricotta cheese, fresh mozzarella, or Swiss cheese. Low-sodium or reduced-sodium cheese. Cream cheese. Yogurt. Seasonings and condiments Fresh and dried herbs and spices. Salt-free seasonings. Low-sodium mustard and ketchup. Sodium-free salad dressing. Sodium-free light mayonnaise. Fresh or refrigerated horseradish. Lemon juice. Vinegar. Other foods Homemade, reduced-sodium, or low-sodium soups. Unsalted popcorn and pretzels. Low-salt or salt-free chips. The items listed above may not be a complete list of foods and beverages you can eat. Contact a dietitian for more information. What foods should I avoid? Vegetables Sauerkraut, pickled vegetables, and relishes. Olives. [place] fries. Onion rings. Regular canned vegetables (not low-sodium or reduced-sodium). Regular canned tomato sauce and paste (not low-sodium or reduced-sodium). Regular tomato and vegetable juice (not low-sodium or reduced-sodium). Frozen vegetables in sauces. Grains Instant hot cereals. Bread stuffing, pancake, and biscuit mixes. Croutons. Seasoned rice or pasta mixes. Noodle soup cups. Boxed or frozen macaroni and cheese. Regular salted crackers. Self-rising flour. Meats and other proteins Meat or fish that is salted, canned, smoked, spiced, or pickled. Precooked or cured meat, such as sausages or meat loaves. Berniece Salines. Ham. Pepperoni. Hot dogs. Corned beef. Chipped beef. Salt pork. Jerky. Pickled herring. Anchovies and sardines. Regular canned tuna. Salted nuts. Dairy Processed cheese and cheese spreads. Hard cheeses. Cheese curds. Blue cheese. Feta cheese. String cheese. Regular cottage cheese. Buttermilk. Canned milk. Fats and oils Salted butter. Regular margarine. Ghee. Bacon fat. Seasonings and condiments Onion salt, garlic salt, seasoned salt, table salt, and sea salt. Canned and packaged gravies. Worcestershire sauce. Tartar sauce. Barbecue sauce. Teriyaki sauce. Soy  sauce, including reduced-sodium. Steak sauce. Fish sauce. Oyster sauce. Cocktail sauce. Horseradish that you find on the shelf. Regular ketchup and mustard. Meat flavorings and tenderizers. Bouillon cubes. Hot sauce. Pre-made or packaged marinades. Pre-made or packaged taco seasonings. Relishes. Regular salad dressings. Salsa. Other foods Salted popcorn and pretzels. Corn chips and puffs. Potato and tortilla chips. Canned or dried soups. Pizza. Frozen entrees and pot pies. The items listed above may not be a complete list of foods and beverages you should avoid. Contact a dietitian for more information. Summary  Eating less sodium can help lower your blood pressure, reduce swelling, and protect your heart, liver, and kidneys.  Most people on this plan should limit their sodium intake to 1,500-2,000 mg (milligrams) of sodium each day.  Canned, boxed, and frozen foods are high in sodium. Restaurant foods, fast foods, and pizza are also very high in sodium. You also get sodium by adding salt to food.  Try to cook at home, eat more fresh fruits and vegetables, and eat less fast food and canned, processed, or prepared foods. This information is not intended to replace advice given to you by your health care provider. Make sure you discuss any questions you have with your health care provider. Document Revised: 01/10/2020 Document Reviewed: 11/06/2019 Elsevier Patient Education  2021 [employer].

## 2022-08-18 NOTE — Progress Notes (Signed)
Cardiology Office Note   Date:  08/19/2022   ID:  Doris Robinson, DOB Oct 06, 1956, MRN [id number]  PCP:  Azzie Glatter, FNP    No chief complaint on file.  HTN  Wt Readings from Last 3 Encounters:  08/19/22 182 lb 6.4 oz (82.7 kg)  05/21/21 178 lb 3.2 oz (80.8 kg)  08/31/18 166 lb 8 oz (75.5 kg)       History of Present Illness: Doris Robinson is a 66 y.o. female  with hx of DM, HTN and HLD.     She has had issues with high BP readings in the office.  WHen she goes home, BP can be in the normal range. In Feb 2022, losartan was changed to irbesartan and Bystolic was added.   Husband noted snoring in the past. Declined sleep study in the past.  She did not feel that she was getting restful sleep. She wanted to try to lose weight first before considering sleep study.  Denies : Chest pain. Dizziness. Leg edema. Nitroglycerin use. Orthopnea. Palpitations. Paroxysmal nocturnal dyspnea. Shortness of breath. Syncope.     Past Medical History:  Diagnosis Date   Allergy    seasonal   Diabetes mellitus without complication ([place])    Hyperlipidemia    Hypertension    IFG (impaired fasting glucose)    Osteopenia     Past Surgical History:  Procedure Laterality Date   COLONOSCOPY     DILATION AND CURETTAGE OF UTERUS       Current Outpatient Medications  Medication Sig Dispense Refill   acetaminophen (TYLENOL) 500 MG tablet Take 500 mg by mouth every 6 (six) hours as needed.     cholecalciferol (VITAMIN D) 1000 units tablet Take 2,000 Units by mouth daily.     clobetasol cream (TEMOVATE) 0.05 % Apply topically as needed.     fluticasone (FLONASE) 50 MCG/ACT nasal spray Place 1 spray into both nostrils as needed for allergies.     glucosamine-chondroitin 500-400 MG tablet Take 1 tablet by mouth 3 (three) times daily.     irbesartan (AVAPRO) 300 MG tablet Take 300 mg by mouth every morning.     JANUVIA 100 MG tablet Take 100 mg by mouth daily.     metFORMIN  (GLUCOPHAGE-XR) 500 MG 24 hr tablet Take 500 mg by mouth at bedtime.     MINOXIDIL, TOPICAL, 5 % SOLN at bedtime.     Multiple Vitamins-Minerals (MULTIVITAMIN ADULT PO) Take by mouth daily. Centrum Silver     nebivolol (BYSTOLIC) 5 MG tablet Take 5 mg by mouth daily.     rosuvastatin (CRESTOR) 20 MG tablet Take 20 mg by mouth daily.  3   vitamin C (ASCORBIC ACID) 500 MG tablet Take 500 mg by mouth daily.     Vitamins-Lipotropics (LIPO-FLAVONOID PLUS PO) Take by mouth daily.     Current Facility-Administered Medications  Medication Dose Route Frequency Provider Last Rate Last Admin   0.9 %  sodium chloride infusion  500 mL Intravenous Once Pyrtle, Lajuan Lines, MD        Allergies:   Codeine and Sulfa antibiotics    Social History:  The patient  reports that she has never smoked. She has never used smokeless tobacco. She reports current alcohol use. She reports that she does not use drugs.   Family History:  The patient's family history includes Asthma in her father; Cancer in her mother; Coronary artery disease in her father; Diabetes in her father; Esophageal cancer in her  mother; Heart disease in her father; Hyperlipidemia in her father; Hypertension in her father; Stroke in her maternal grandmother; Thyroid disease in her father.    ROS:  Please see the history of present illness.   Otherwise, review of systems are positive for rare fatigue episodes.   All other systems are reviewed and negative.    PHYSICAL EXAM: VS:  BP (!) 144/84   Pulse 69   Ht '5\' 3"'$  (1.6 m)   Wt 182 lb 6.4 oz (82.7 kg)   SpO2 98%   BMI 32.31 kg/m  , BMI Body mass index is 32.31 kg/m. GEN: Well nourished, well developed, in no acute distress HEENT: normal Neck: no JVD, carotid bruits, or masses Cardiac: RRR; no murmurs, rubs, or gallops,no edema  Respiratory:  clear to auscultation bilaterally, normal work of breathing GI: soft, nontender, nondistended, + BS, obese MS: no deformity or atrophy Skin: warm and  dry, no rash Neuro:  Strength and sensation are intact Psych: euthymic mood, full affect   EKG:   The ekg ordered today demonstrates NSR, NSST, no change from prior   Recent Labs: No results found for requested labs within last 365 days.   Lipid Panel    Component Value Date/Time   CHOL 181 02/10/2014 0816   TRIG 118 02/10/2014 0816   HDL 56 02/10/2014 0816   CHOLHDL 3.2 02/10/2014 0816   VLDL 24 02/10/2014 0816   LDLCALC 101 (H) 02/10/2014 0816     Other studies Reviewed: Additional studies/ records that were reviewed today with results demonstrating: labs reviewed. . Cr 0.90   ASSESSMENT AND PLAN:  HTN: Home cuff checked today in the office.  Minimize salt intake.  Avoid processed foods. Increase fiber intake.  Discussed calcium scoring CT given risk factors for CAD.  She is willing to have this done.  This could help with risk factor modification. Hyperlipidemia: August 2023 total cholesterol 188 triglycerides 146 HDL 58 LDL 101.  Continue rosuvastatin 20 mg daily.  If the calcium score is elevated, would increase the dose of a cholesterol-lowering. Snoring: Sleeping better when sleeping on her side.  Blood pressure controlled on medicines.  Not interested in sleep study at this time.   Current medicines are reviewed at length with the patient today.  The patient concerns regarding her medicines were addressed.  The following changes have been made:  No change  Labs/ tests ordered today include:  No orders of the defined types were placed in this encounter.   Recommend 150 minutes/week of aerobic exercise Low fat, low carb, high fiber diet recommended  Disposition:   FU in 1 year   Signed, Larae Grooms, MD  08/19/2022 3:41 PM    [facility] [place] [place], [place], [place]  [address] Phone: [phone]; Fax: [phone]

## 2022-08-19 ENCOUNTER — Ambulatory Visit: Payer: Medicare Other | Attending: Interventional Cardiology | Admitting: Interventional Cardiology

## 2022-08-19 ENCOUNTER — Encounter: Payer: Self-pay | Admitting: Interventional Cardiology

## 2022-08-19 VITALS — BP 144/84 | HR 69 | Ht 63.0 in | Wt 182.4 lb

## 2022-08-19 DIAGNOSIS — I10 Essential (primary) hypertension: Secondary | ICD-10-CM | POA: Insufficient documentation

## 2022-08-19 DIAGNOSIS — R0683 Snoring: Secondary | ICD-10-CM | POA: Insufficient documentation

## 2022-08-19 DIAGNOSIS — E782 Mixed hyperlipidemia: Secondary | ICD-10-CM | POA: Insufficient documentation

## 2022-08-19 NOTE — Patient Instructions (Signed)
Medication Instructions:  Your physician recommends that you continue on your current medications as directed. Please refer to the Current Medication list given to you today.  *If you need a refill on your cardiac medications before your next appointment, please call your pharmacy*   Lab Work: none If you have labs (blood work) drawn today and your tests are completely normal, you will receive your results only by: [place] (if you have MyChart) OR A paper copy in the mail If you have any lab test that is abnormal or we need to change your treatment, we will call you to review the results.   Testing/Procedures: Dr Irish Lack recommends you have a Calcium Score CT Scan   Follow-Up: At [facility], you and your health needs are our priority.  As part of our continuing mission to provide you with exceptional heart care, we have created designated Provider Care Teams.  These Care Teams include your primary Cardiologist (physician) and Advanced Practice Providers (APPs -  Physician Assistants and Nurse Practitioners) who all work together to provide you with the care you need, when you need it.  We recommend signing up for the patient portal called "MyChart".  Sign up information is provided on this After Visit Summary.  MyChart is used to connect with patients for Virtual Visits (Telemedicine).  Patients are able to view lab/test results, encounter notes, upcoming appointments, etc.  Non-urgent messages can be sent to your provider as well.   To learn more about what you can do with MyChart, go to [email or web address].    Your next appointment:   12 month(s)  The format for your next appointment:   In Person  Provider:   Larae Grooms, MD     Other Instructions    Important Information About Sugar

## 2022-08-23 ENCOUNTER — Ambulatory Visit (HOSPITAL_BASED_OUTPATIENT_CLINIC_OR_DEPARTMENT_OTHER)
Admission: RE | Admit: 2022-08-23 | Discharge: 2022-08-23 | Disposition: A | Discharge status: Home / Self Care | Payer: Self-pay | Source: Ambulatory Visit | Attending: Interventional Cardiology | Admitting: Interventional Cardiology

## 2022-08-23 DIAGNOSIS — I10 Essential (primary) hypertension: Secondary | ICD-10-CM | POA: Insufficient documentation

## 2022-08-23 DIAGNOSIS — E782 Mixed hyperlipidemia: Secondary | ICD-10-CM | POA: Insufficient documentation

## 2022-09-08 ENCOUNTER — Other Ambulatory Visit (HOSPITAL_COMMUNITY): Payer: Self-pay

## 2023-05-22 ENCOUNTER — Other Ambulatory Visit: Payer: Self-pay | Admitting: Obstetrics and Gynecology

## 2023-05-22 DIAGNOSIS — R928 Other abnormal and inconclusive findings on diagnostic imaging of breast: Secondary | ICD-10-CM

## 2023-05-26 ENCOUNTER — Encounter: Payer: Self-pay | Admitting: Internal Medicine

## 2023-06-05 ENCOUNTER — Ambulatory Visit
Admission: RE | Admit: 2023-06-05 | Discharge: 2023-06-05 | Disposition: A | Discharge status: Home / Self Care | Payer: Medicare Other | Source: Ambulatory Visit | Attending: Obstetrics and Gynecology | Admitting: Obstetrics and Gynecology

## 2023-06-05 DIAGNOSIS — R928 Other abnormal and inconclusive findings on diagnostic imaging of breast: Secondary | ICD-10-CM

## 2023-07-19 ENCOUNTER — Ambulatory Visit (AMBULATORY_SURGERY_CENTER): Payer: Medicare Other

## 2023-07-19 VITALS — Ht 63.0 in | Wt 170.0 lb

## 2023-07-19 DIAGNOSIS — Z1211 Encounter for screening for malignant neoplasm of colon: Secondary | ICD-10-CM

## 2023-07-19 MED ORDER — NA SULFATE-K SULFATE-MG SULF 17.5-3.13-1.6 GM/177ML PO SOLN
1.0000 | Freq: Once | ORAL | 0 refills | Status: AC
Start: 2023-07-19 — End: 2023-07-19

## 2023-07-19 NOTE — Progress Notes (Signed)

## 2023-08-17 ENCOUNTER — Ambulatory Visit: Payer: Medicare Other | Admitting: Internal Medicine

## 2023-08-17 ENCOUNTER — Encounter: Payer: Self-pay | Admitting: Internal Medicine

## 2023-08-17 VITALS — BP 133/72 | HR 58 | Temp 97.1°F | Resp 13 | Ht 63.0 in | Wt 170.0 lb

## 2023-08-17 DIAGNOSIS — Z09 Encounter for follow-up examination after completed treatment for conditions other than malignant neoplasm: Secondary | ICD-10-CM

## 2023-08-17 DIAGNOSIS — Z1211 Encounter for screening for malignant neoplasm of colon: Secondary | ICD-10-CM

## 2023-08-17 DIAGNOSIS — Z86010 Personal history of colonic polyps: Secondary | ICD-10-CM

## 2023-08-17 DIAGNOSIS — D124 Benign neoplasm of descending colon: Secondary | ICD-10-CM

## 2023-08-17 MED ORDER — SODIUM CHLORIDE 0.9 % IV SOLN
500.0000 mL | Freq: Once | INTRAVENOUS | Status: DC
Start: 2023-08-17 — End: 2023-08-17

## 2023-08-17 NOTE — Progress Notes (Signed)
Report to PACU, RN, vss, BBS= Clear.  

## 2023-08-17 NOTE — Op Note (Signed)
[facility] Patient Name: Doris Robinson Procedure Date: 08/17/2023 8:04 AM MRN: [id number] Endoscopist: Beverley Fiedler , MD, [id number] Age: 67 Referring MD:  Date of Birth: 07-05-1956 Gender: Female Account #: [id number] Procedure:                Colonoscopy Indications:              High risk colon cancer surveillance: Personal                            history of non-advanced adenoma, Last colonoscopy:                            June 2019 Medicines:                Monitored Anesthesia Care Procedure:                Pre-Anesthesia Assessment:                           - Prior to the procedure, a History and Physical                            was performed, and patient medications and                            allergies were reviewed. The patient's tolerance of                            previous anesthesia was also reviewed. The risks                            and benefits of the procedure and the sedation                            options and risks were discussed with the patient.                            All questions were answered, and informed consent                            was obtained. Prior Anticoagulants: The patient has                            taken no anticoagulant or antiplatelet agents. ASA                            Grade Assessment: II - A patient with mild systemic                            disease. After reviewing the risks and benefits,                            the patient was deemed in satisfactory condition to  undergo the procedure.                           After obtaining informed consent, the colonoscope                            was passed under direct vision. Throughout the                            procedure, the patient's blood pressure, pulse, and                            oxygen saturations were monitored continuously. The                            CF [id number] #[id number] was introduced through the anus                             and advanced to the cecum, identified by                            appendiceal orifice and ileocecal valve. The                            colonoscopy was performed without difficulty. The                            patient tolerated the procedure well. The quality                            of the bowel preparation was good. The ileocecal                            valve, appendiceal orifice, and rectum were                            photographed. Scope In: 8:09:51 AM Scope Out: 8:23:10 AM Scope Withdrawal Time: 0 hours 8 minutes 22 seconds  Total Procedure Duration: 0 hours 13 minutes 19 seconds  Findings:                 The digital rectal exam was normal.                           A 4 mm polyp was found in the descending colon. The                            polyp was sessile. The polyp was removed with a                            cold snare. Resection and retrieval were complete.                           Multiple small-mouthed diverticula were found in  the sigmoid colon.                           The exam was otherwise without abnormality on                            direct and retroflexion views. Complications:            No immediate complications. Estimated Blood Loss:     Estimated blood loss: none. Impression:               - One 4 mm polyp in the descending colon, removed                            with a cold snare. Resected and retrieved.                           - Mild diverticulosis in the sigmoid colon.                           - The examination was otherwise normal on direct                            and retroflexion views. Recommendation:           - Patient has a contact number available for                            emergencies. The signs and symptoms of potential                            delayed complications were discussed with the                            patient. Return to normal activities  tomorrow.                            Written discharge instructions were provided to the                            patient.                           - Resume previous diet.                           - Continue present medications.                           - Await pathology results.                           - Repeat colonoscopy is recommended for                            surveillance. The colonoscopy date will be  determined after pathology results from today's                            exam become available for review. Beverley Fiedler, MD 08/17/2023 8:39:34 AM This report has been signed electronically.

## 2023-08-17 NOTE — Progress Notes (Signed)
Pt's states no medical or surgical changes since previsit or office visit. 

## 2023-08-17 NOTE — Patient Instructions (Addendum)
Handouts Provided:  Polyps and Diverticulosis  YOU HAD AN ENDOSCOPIC PROCEDURE TODAY AT THE  ENDOSCOPY CENTER:   Refer to the procedure report that was given to you for any specific questions about what was found during the examination.  If the procedure report does not answer your questions, please call your gastroenterologist to clarify.  If you requested that your care partner not be given the details of your procedure findings, then the procedure report has been included in a sealed envelope for you to review at your convenience later.  YOU SHOULD EXPECT: Some feelings of bloating in the abdomen. Passage of more gas than usual.  Walking can help get rid of the air that was put into your GI tract during the procedure and reduce the bloating. If you had a lower endoscopy (such as a colonoscopy or flexible sigmoidoscopy) you may notice spotting of blood in your stool or on the toilet paper. If you underwent a bowel prep for your procedure, you may not have a normal bowel movement for a few days.  Please Note:  You might notice some irritation and congestion in your nose or some drainage.  This is from the oxygen used during your procedure.  There is no need for concern and it should clear up in a day or so.  SYMPTOMS TO REPORT IMMEDIATELY:  Following lower endoscopy (colonoscopy or flexible sigmoidoscopy):  Excessive amounts of blood in the stool  Significant tenderness or worsening of abdominal pains  Swelling of the abdomen that is new, acute  Fever of 100F or higher  For urgent or emergent issues, a gastroenterologist can be reached at any hour by calling [phone]. Do not use MyChart messaging for urgent concerns.    DIET:  We do recommend a small meal at first, but then you may proceed to your regular diet.  Drink plenty of fluids but you should avoid alcoholic beverages for 24 hours.  ACTIVITY:  You should plan to take it easy for the rest of today and you should NOT DRIVE  or use heavy machinery until tomorrow (because of the sedation medicines used during the test).    FOLLOW UP: Our staff will call the number listed on your records the next business day following your procedure.  We will call around 7:15- 8:00 am to check on you and address any questions or concerns that you may have regarding the information given to you following your procedure. If we do not reach you, we will leave a message.     If any biopsies were taken you will be contacted by phone or by letter within the next 1-3 weeks.  Please call us at [phone] if you have not heard about the biopsies in 3 weeks.    SIGNATURES/CONFIDENTIALITY: You and/or your care partner have signed paperwork which will be entered into your electronic medical record.  These signatures attest to the fact that that the information above on your After Visit Summary has been reviewed and is understood.  Full responsibility of the confidentiality of this discharge information lies with you and/or your care-partner.  

## 2023-08-17 NOTE — Progress Notes (Signed)
GASTROENTEROLOGY PROCEDURE H&P NOTE   Primary Care Physician: Harold Hedge, MD    Reason for Procedure:  History of colon polyp.  Plan:    Colonoscopy  Patient is appropriate for endoscopic procedure(s) in the ambulatory ([facility]) setting.  The nature of the procedure, as well as the risks, benefits, and alternatives were carefully and thoroughly reviewed with the patient. Ample time for discussion and questions allowed. The patient understood, was satisfied, and agreed to proceed.     HPI: Doris Robinson is a 67 y.o. female who presents for colonoscopy.  Medical history as below.  Tolerated the prep.  No recent chest pain or shortness of breath.  No abdominal pain today.  Past Medical History:  Diagnosis Date   Allergy    seasonal   Diabetes mellitus without complication (HCC)    Hyperlipidemia    Hypertension    IFG (impaired fasting glucose)    Osteopenia     Past Surgical History:  Procedure Laterality Date   COLONOSCOPY     DILATION AND CURETTAGE OF UTERUS      Prior to Admission medications   Medication Sig Start Date End Date Taking? Authorizing Provider  acetaminophen (TYLENOL) 500 MG tablet Take 500 mg by mouth every 6 (six) hours as needed.   Yes [provider]  fluticasone (FLONASE) 50 MCG/ACT nasal spray Place 1 spray into both nostrils as needed for allergies. 06/09/16  Yes [provider]  irbesartan (AVAPRO) 300 MG tablet Take 300 mg by mouth every morning. 02/13/21  Yes [provider]  JANUVIA 100 MG tablet Take 100 mg by mouth daily. 02/09/21  Yes [provider]  metFORMIN (GLUCOPHAGE-XR) 500 MG 24 hr tablet Take 500 mg by mouth at bedtime. 02/08/21  Yes [provider]  Multiple Vitamins-Minerals (MULTIVITAMIN ADULT PO) Take by mouth daily. Centrum Silver   Yes [provider]  nebivolol (BYSTOLIC) 5 MG tablet Take 5 mg by mouth daily. 03/16/21  Yes [provider]  rosuvastatin (CRESTOR)  20 MG tablet Take 20 mg by mouth daily. 04/16/18  Yes [provider]  vitamin C (ASCORBIC ACID) 500 MG tablet Take 500 mg by mouth daily.   Yes [provider]  Vitamins-Lipotropics (LIPO-FLAVONOID PLUS PO) Take by mouth daily.   Yes [provider]  cholecalciferol (VITAMIN D) 1000 units tablet Take 2,000 Units by mouth daily.    [provider]  clobetasol cream (TEMOVATE) 0.05 % Apply topically as needed. 04/10/21   [provider]  glucosamine-chondroitin 500-400 MG tablet Take 1 tablet by mouth 3 (three) times daily.    [provider]  MINOXIDIL, TOPICAL, 5 % SOLN at bedtime. Patient not taking: Reported on 07/19/2023 07/18/19   [provider]    Current Outpatient Medications  Medication Sig Dispense Refill   acetaminophen (TYLENOL) 500 MG tablet Take 500 mg by mouth every 6 (six) hours as needed.     fluticasone (FLONASE) 50 MCG/ACT nasal spray Place 1 spray into both nostrils as needed for allergies.     irbesartan (AVAPRO) 300 MG tablet Take 300 mg by mouth every morning.     JANUVIA 100 MG tablet Take 100 mg by mouth daily.     metFORMIN (GLUCOPHAGE-XR) 500 MG 24 hr tablet Take 500 mg by mouth at bedtime.     Multiple Vitamins-Minerals (MULTIVITAMIN ADULT PO) Take by mouth daily. Centrum Silver     nebivolol (BYSTOLIC) 5 MG tablet Take 5 mg by mouth daily.  rosuvastatin (CRESTOR) 20 MG tablet Take 20 mg by mouth daily.  3   vitamin C (ASCORBIC ACID) 500 MG tablet Take 500 mg by mouth daily.     Vitamins-Lipotropics (LIPO-FLAVONOID PLUS PO) Take by mouth daily.     cholecalciferol (VITAMIN D) 1000 units tablet Take 2,000 Units by mouth daily.     clobetasol cream (TEMOVATE) 0.05 % Apply topically as needed.     glucosamine-chondroitin 500-400 MG tablet Take 1 tablet by mouth 3 (three) times daily.     MINOXIDIL, TOPICAL, 5 % SOLN at bedtime. (Patient not taking: Reported on 07/19/2023)     Current  Facility-Administered Medications  Medication Dose Route Frequency Provider Last Rate Last Admin   0.9 %  sodium chloride infusion  500 mL Intravenous Once Krysteena Stalker, Carie Caddy, MD       0.9 %  sodium chloride infusion  500 mL Intravenous Once Kassius Battiste, Carie Caddy, MD        Allergies as of 08/17/2023 - Review Complete 08/17/2023  Allergen Reaction Noted   Codeine Nausea And Vomiting and Other (See Comments) 03/30/2015   Sulfa antibiotics Hives 02/11/2014    Family History  Problem Relation Age of Onset   Heart disease Father    Diabetes Father    Hyperlipidemia Father    Hypertension Father    Asthma Father    Thyroid disease Father    Coronary artery disease Father    Stroke Maternal Grandmother    Cancer Mother        Troat Cancer   Esophageal cancer Mother    Colon cancer Neg Hx    Colon polyps Neg Hx    Rectal cancer Neg Hx    Stomach cancer Neg Hx     Social History   Socioeconomic History   Marital status: Married    Spouse name: [name]   Number of children: 0   Years of education: 16   Highest education level: Not on file  Occupational History   Occupation: RETIRED  Tobacco Use   Smoking status: Never   Smokeless tobacco: Never  Vaping Use   Vaping status: Never Used  Substance and Sexual Activity   Alcohol use: Yes    Comment: 1 glass a week   Drug use: No   Sexual activity: Not Currently  Other Topics Concern   Not on file  Social History Narrative   Marital Status:  Married Personnel officer)   Children:  None    Pets:  None    Living Situation: Lives with spouse   Occupation: Retired    Education: Engineer, maintenance (IT)   Tobacco Use/Exposure:  None    Alcohol Use:  Occasional   Drug Use:  None   Diet:  Regular   Exercise:  Walking 60 minutes per day   Hobbies: Reading, Copy   Social Determinants of Health   Financial Resource Strain: Low Risk  (03/20/2021)   [facility] from [facility] prior to 02/18/2023., [facility]  [facility] visits prior to 02/18/2023.   Overall Financial Resource Strain (CARDIA)    Difficulty of Paying Living Expenses: Not hard at all  Food Insecurity: Low Risk  (03/16/2023)   [facility]   Food vital sign    Within the past 12 months, you worried that your food would run out before you got money to buy more: Never true    Within the past 12 months, the food you bought just didn't  last and you didn't have money to get more. : Never true  Transportation Needs: No Transportation Needs (03/16/2023)   [facility]   Transportation    In the past 12 months, has lack of reliable transportation kept you from medical appointments, meetings, work or from getting things needed for daily living? : No  Physical Activity: Insufficiently Active (03/20/2021)   [facility] visits prior to 02/18/2023., [facility] visits prior to 02/18/2023.   Exercise Vital Sign    Days of Exercise per Week: 3 days    Minutes of Exercise per Session: 40 min  Stress: Stress Concern Present (03/20/2021)   [facility] from [facility] visits prior to 02/18/2023., [facility] visits prior to 02/18/2023.   [employer] of Occupational Health - Occupational Stress Questionnaire    Feeling of Stress : To some extent  Social Connections: Unknown (03/20/2021)   Received from [facility] visits prior to 02/18/2023., [facility] visits prior to 02/18/2023.   Social Advertising account executive [NHANES]    Frequency of Communication with Friends and Family: More than three times a week    Frequency of Social Gatherings with Friends and Family: Once a week    Attends Religious Services: Patient refused    Active Member of Clubs or Organizations: Yes    Attends Banker Meetings: More than 4 times per year    Marital Status:  Married  Catering manager Violence: Not At Risk (03/20/2021)   [facility] prior to 02/18/2023., [facility] visits prior to 02/18/2023.   Humiliation, Afraid, Rape, and Kick questionnaire    Fear of Current or Ex-Partner: No    Emotionally Abused: No    Physically Abused: No    Sexually Abused: No    Physical Exam: Vital signs in last 24 hours: @BP  (!) 191/88   Pulse 74   Temp (!) 97.1 F (36.2 C) (Skin)   Ht 5\' 3"  (1.6 m)   Wt 170 lb (77.1 kg)   SpO2 95%   BMI 30.11 kg/m  GEN: NAD EYE: Sclerae anicteric ENT: MMM CV: Non-tachycardic Pulm: CTA b/l GI: Soft, NT/ND NEURO:  Alert & Oriented x 3   Erick Blinks, MD [facility]  08/17/2023 8:04 AM

## 2023-08-18 ENCOUNTER — Telehealth: Payer: Self-pay | Admitting: *Deleted

## 2023-08-18 NOTE — Telephone Encounter (Signed)
  Follow up Call-     08/17/2023    7:11 AM  Call back number  Post procedure Call Back phone  # [phone]  Permission to leave phone message Yes     Patient questions:  Do you have a fever, pain , or abdominal swelling? No. Pain Score  0 *  Have you tolerated food without any problems? Yes.    Have you been able to return to your normal activities? Yes.    Do you have any questions about your discharge instructions: Diet   No. Medications  No. Follow up visit  No.  Do you have questions or concerns about your Care? No.  Actions: * If pain score is 4 or above: No action needed, pain <4.

## 2023-08-24 ENCOUNTER — Encounter: Payer: Self-pay | Admitting: Internal Medicine
